# Patient Record
Sex: Female | Born: 1970 | Race: White | Hispanic: No | Marital: Single | Smoking: Current every day smoker
Health system: Southern US, Community
[De-identification: ages and names within clinical notes are randomized; demographics above are authoritative.]

## PROBLEM LIST (undated history)

## (undated) DIAGNOSIS — D34 Benign neoplasm of thyroid gland: Secondary | ICD-10-CM

## (undated) DIAGNOSIS — G4700 Insomnia, unspecified: Secondary | ICD-10-CM

## (undated) DIAGNOSIS — R51 Headache: Secondary | ICD-10-CM

## (undated) DIAGNOSIS — Z9889 Other specified postprocedural states: Secondary | ICD-10-CM

## (undated) DIAGNOSIS — D369 Benign neoplasm, unspecified site: Secondary | ICD-10-CM

## (undated) HISTORY — DX: Headache: R51

## (undated) HISTORY — DX: Other specified postprocedural states: Z98.890

## (undated) HISTORY — DX: Benign neoplasm, unspecified site: D36.9

## (undated) HISTORY — DX: Insomnia, unspecified: G47.00

## (undated) HISTORY — DX: Benign neoplasm of thyroid gland: D34

---

## 1998-02-16 ENCOUNTER — Other Ambulatory Visit: Admission: RE | Admit: 1998-02-16 | Discharge: 1998-02-16 | Payer: Self-pay | Admitting: Obstetrics and Gynecology

## 1999-07-13 ENCOUNTER — Emergency Department (HOSPITAL_COMMUNITY): Admission: EM | Admit: 1999-07-13 | Discharge: 1999-07-13 | Payer: Self-pay | Admitting: Internal Medicine

## 2001-09-05 ENCOUNTER — Encounter: Payer: Self-pay | Admitting: Emergency Medicine

## 2001-09-05 ENCOUNTER — Emergency Department (HOSPITAL_COMMUNITY): Admission: EM | Admit: 2001-09-05 | Discharge: 2001-09-05 | Payer: Self-pay | Admitting: Emergency Medicine

## 2002-08-16 ENCOUNTER — Emergency Department (HOSPITAL_COMMUNITY): Admission: EM | Admit: 2002-08-16 | Discharge: 2002-08-16 | Payer: Self-pay | Admitting: Emergency Medicine

## 2002-08-16 ENCOUNTER — Other Ambulatory Visit (HOSPITAL_COMMUNITY): Admission: RE | Admit: 2002-08-16 | Discharge: 2002-09-09 | Payer: Self-pay | Admitting: Psychiatry

## 2002-11-18 ENCOUNTER — Inpatient Hospital Stay (HOSPITAL_COMMUNITY): Admission: EM | Admit: 2002-11-18 | Discharge: 2002-11-21 | Payer: Self-pay | Admitting: Psychiatry

## 2003-04-11 ENCOUNTER — Other Ambulatory Visit: Admission: RE | Admit: 2003-04-11 | Discharge: 2003-04-11 | Payer: Self-pay | Admitting: Obstetrics and Gynecology

## 2003-10-12 ENCOUNTER — Inpatient Hospital Stay (HOSPITAL_COMMUNITY): Admission: RE | Admit: 2003-10-12 | Discharge: 2003-10-15 | Payer: Self-pay | Admitting: Obstetrics and Gynecology

## 2003-12-06 ENCOUNTER — Other Ambulatory Visit: Admission: RE | Admit: 2003-12-06 | Discharge: 2003-12-06 | Payer: Self-pay | Admitting: Obstetrics and Gynecology

## 2004-09-30 DIAGNOSIS — D369 Benign neoplasm, unspecified site: Secondary | ICD-10-CM

## 2004-09-30 DIAGNOSIS — D34 Benign neoplasm of thyroid gland: Secondary | ICD-10-CM

## 2004-09-30 HISTORY — DX: Benign neoplasm, unspecified site: D36.9

## 2004-09-30 HISTORY — DX: Benign neoplasm of thyroid gland: D34

## 2004-10-25 ENCOUNTER — Ambulatory Visit (HOSPITAL_COMMUNITY): Admission: RE | Admit: 2004-10-25 | Discharge: 2004-10-25 | Payer: Self-pay | Admitting: Internal Medicine

## 2008-10-26 ENCOUNTER — Emergency Department (HOSPITAL_COMMUNITY): Admission: EM | Admit: 2008-10-26 | Discharge: 2008-10-26 | Payer: Self-pay | Admitting: Emergency Medicine

## 2008-10-30 ENCOUNTER — Emergency Department (HOSPITAL_COMMUNITY): Admission: EM | Admit: 2008-10-30 | Discharge: 2008-10-30 | Payer: Self-pay | Admitting: Emergency Medicine

## 2008-11-16 ENCOUNTER — Encounter: Admission: RE | Admit: 2008-11-16 | Discharge: 2008-11-16 | Payer: Self-pay | Admitting: Orthopaedic Surgery

## 2010-09-30 HISTORY — PX: MASTECTOMY: SHX3

## 2010-10-21 ENCOUNTER — Encounter: Payer: Self-pay | Admitting: Endocrinology

## 2011-02-15 NOTE — Discharge Summary (Signed)
NAMESATSUKI, ZILLMER                        ACCOUNT NO.:  [id number]   MEDICAL RECORD NO.:  [id number]                   PATIENT TYPE:  INP   LOCATION:  9133                                 FACILITY:  [facility]   PHYSICIAN:  Maxie Better, M.D.            DATE OF BIRTH:  09-Nov-1970   DATE OF ADMISSION:  10/12/2003  DATE OF DISCHARGE:  10/15/2003                                 DISCHARGE SUMMARY   ADMISSION DIAGNOSIS:  Term gestation, previous difficult delivery.   DISCHARGE DIAGNOSES:  1. Term gestation, delivered.  2. Status post primary cesarean section.  3. Previous difficult delivery.  4. Polyhydramnios.  5. Postoperative anemia.   PROCEDURES:  Primary cesarean section, Kerr hysterotomy.   HISTORY OF PRESENT ILLNESS:  A 40 year old gravida 3 para 1-0-1-1 female at  term admitted for a primary cesarean section secondary to previous difficult  delivery.  The patient had an 8-pound 4-ounce baby via previous attempt at  vacuum delivery and a mid forceps application.  Please see the dictated H&P  for the specific details.  The patient had an uncomplicated prenatal course.   HOSPITAL COURSE:  The patient was admitted to [facility].  She was  taken to the operating room where she underwent a primary cesarean section  with resultant delivery of an 8 pound live female infant with cord around  the neck x1, Apgars of 9 and 9.  Copious amount of clear amniotic fluid was  noted.  The vertex was floating at the time of delivery.  Normal tubes and  ovaries were noted bilaterally.  The patient had an uncomplicated  postoperative course.  Her CBC on postoperative day #1 showed a hemoglobin  9.7; hematocrit 27.5; white count of 10.4; platelet count of 166,000.  By  postoperative day #3 the patient who had remained afebrile showed no  evidence of infection.  She was tolerating a regular diet, was deemed well  to be discharged home.   DISPOSITION:  Home.   CONDITION:  Stable.   DISCHARGE MEDICATIONS:  1. Tylox one to two tablets q.3-4h. p.r.n. pain #18.  2. Slow FE one p.o. b.i.d.  3. Prenatal vitamins one p.o. daily.   FOLLOW-UP:  Follow-up appointment at [facility] in 4 weeks.   DISCHARGE INSTRUCTIONS:  1. Call for temperature greater than or equal to 100.4.  2. Nothing per vagina for 4-6 weeks.  3. No heavy lifting or driving for 2 weeks.  4. Call if severe abdominal pain, nausea or vomiting, increased incisional     pain, redness and drainage from the incisional site, soaking a regular     pad every hour or more frequently.                                               Maxie Better, M.D.  [place]/MEDQ  D:  11/18/2003  T:  11/19/2003  Job:  [id number]

## 2011-02-15 NOTE — Op Note (Signed)
NAME:  [name], [name]                        ACCOUNT NO.:  [id number]   MEDICAL RECORD NO.:  [id number]                   PATIENT TYPE:  INP   LOCATION:  9198                                 [facility]ILITY:  [facility]   PHYSICIAN:  Maxie Better, M.D.            DATE OF BIRTH:  1971-03-25   DATE OF PROCEDURE:  10/12/2003  DATE OF DISCHARGE:                                 OPERATIVE REPORT   PREOPERATIVE DIAGNOSES:  1. Previous difficult delivery.  2. Term gestation.   PROCEDURE:  Primary cesarean section, Kerr hysterotomy.   POSTOPERATIVE DIAGNOSES:  1. Previous difficult delivery.  2. Term gestation.  3. Polyhydramnios.   ANESTHESIA:  Spinal.   SURGEON:  Maxie Better, M.D.   ASSISTANT:  Genia Del, M.D.   INDICATIONS:  This is a 40 year old gravida 2, para 1, female at term, with  a previous difficult delivery of an 8 pound 3 ounce baby, who now presents  for primary cesarean section.  Her prenatal course has been complicated by  chest pain, for which she underwent extensive cardiology workup with a final  diagnosis of gastroesophageal reflux disease.  Risks and benefits of the  procedure have been explained to the patient, consent was signed.  The  patient was transferred to the operating room.   PROCEDURE:  Under adequate spinal anesthesia, the patient was placed in a  supine position with a left lateral tilt.  She was thoroughly prepped and  draped in the usual fashion.  An indwelling Foley catheter was sterilely  placed.  Marcaine 0.25% 6 mL was injected along the planned Pfannenstiel  skin incision.  The Pfannenstiel skin incision was then made, carried down  to the rectus fascia.  The rectus fascia was incised in the midline,  extended bilaterally.  The rectus fascia was then bluntly and sharply  dissected off the rectus muscle in superior and inferior fashion.  The  rectus muscle was split in the midline, the parietal peritoneum entered  bluntly.  The  vesicouterine peritoneum was then developed.  The bladder was  bluntly dissected off the low uterine segment and displaced inferiorly using  a bladder retractor.  On entering the abdominal cavity and inspection of the  uterus, vertex was noted to be floating.  A curvilinear low transverse  incision was then made and extended bilaterally using bandage scissors.  Artificial rupture of membranes was then performed.  A copious amount of  clear amniotic fluid was noted.  Initial attempt at delivery of the vertex  was unsuccessful due to the mobility of the vertex presentation; therefore,  a vacuum was applied with subsequent delivery of a live female from the  right occiput transverse position.  A cord was around the neck loose, was  easily reduced.  The baby was bulb-suctioned on the abdomen, the cord was  clamped and cut, and the baby was transferred to the awaiting pediatricians,  who assigned Apgars of 9 and 9  at one and five minutes.  The placenta was  spontaneous intact.  The uterine cavity was cleaned of debris.  The uterine  incision showed no extension.  The incision was closed in two layers, a  first layer of 0 Monocryl running locked stitch, second layer was imbricated  using 0 Monocryl suture.  Good hemostasis was then noted.  The paracolic  gutters were cleaned of debris.  Normal tubes and ovaries were noted  bilaterally.  The vesicouterine peritoneum and the parietal peritoneum were  not closed.  The rectus fascia was inspected, no bleeders noted.  The rectus  fascia was closed with 0 Vicryl x2.  The skin was approximated using Ethicon  staples after the subcutaneous area was irrigated and suctioned.  The  specimen was placenta, not sent to pathology.  Estimated blood loss was 600  mL.  Intraoperative fluid was 2 L crystalloid.  Urine output was 200 mL  clear yellow urine.  Sponge and instrument count x2 was correct.  Complication was none.  The patient tolerated the procedure well,  was  transferred to the recovery room in stable condition.  The weight of the  baby was 8 pounds.                                               Maxie Better, M.D.    [facility]/MEDQ  D:  10/12/2003  T:  10/12/2003  Job:  [id number]

## 2011-02-15 NOTE — Discharge Summary (Signed)
[name], [name]                          ACCOUNT NO.:  [id number]   MEDICAL RECORD NO.:  [id number]                   PATIENT TYPE:  IPS   LOCATION:  0300                                 [facility]   PHYSICIAN:  Hipolito Bayley, M.D.               DATE OF BIRTH:  1970-10-06   DATE OF ADMISSION:  11/18/2002  DATE OF DISCHARGE:  11/21/2002                                 DISCHARGE SUMMARY   PATIENT IDENTIFICATION:  The patient is a 40 year old white married female  admitted on involuntary papers.  The patient was admitted because of abuse  of cocaine and marijuana.  She reported poor sleep, irritability, increased  depression, hopelessness and helplessness, as well as feeling powerless over  her addiction.  Prior to current deterioration, she completed chemical  dependency outpatient clinic and stayed sober for over 90 days.   PHYSICAL EXAMINATION:  Initial physical examination was normal.   INITIAL DIAGNOSTIC IMPRESSION:   AXIS I:  1. Major depressive disorder, recurrent.  2. Cocaine abuse, rule out dependence.  3. Cannabis abuse in remission.   AXIS II:  No diagnosis.   AXIS III:  Sinusitis secondary to chemical irritation.   AXIS IV:  Diagnosis deferred.   AXIS V:  Global assessment of functioning 36 upon admission, 74 maximum for  the past year.   HOSPITAL COURSE:  After admission to the ward, the patient was placed on  special observation.  She was started on Symmetrel p.r.n. cravings.  Jeanice Lim, M.D., who saw the patient on February 20, started her on  Wellbutrin XL 150 mg daily, which the patient tolerated well.  On February  22, the patient showed superficially brighter affect, showing some more  insight into her problems.  The patient wanted to go to [facility] for  long-term rehabilitation.  Family meeting with the patient's husband took  place on February 22.  Both the patient and her husband felt that she was  ready for discharge and  decision was made to discharge the patient to  [facility] with admission date scheduled on February 23.  After  discharge from [facility], the patient will follow Intensive Outpatient  Program in [facility].   Medical problems during this brief hospital stay: The patient did not show  any major medical problems.  Review of blood work showed normal CBC with  differential, normal chemistry 17, normal thyroid panel.  Urine drug screen  was positive for cocaine metabolites and benzodiazepines.  Urinalysis was  normal.   Vital signs were stable throughout the hospitalization.   DISCHARGE DIAGNOSES:   AXIS I:  1. Major depressive disorder, recurrent, moderate.  2. Cocaine and cannabis abuse.   AXIS II:  No diagnosis.   AXIS III:  Sinusitis.   AXIS IV:  Moderate stressors, family circumstances, and substance abuse.   AXIS V:  Global assessment of functioning upon admission was 36,  upon  discharge 60, maximum for the past year 30.   DISCHARGE MEDICATIONS:  1. Symmetrel 100 mg to take up to twice a day as needed for cravings.  2. Wellbutrin XL 150 mg daily.  3. Trazodone 50 mg one half or one as needed for insomnia.   DISCHARGE INSTRUCTIONS:  The patient should call or come to the emergency  department if recurrence of symptoms or side effects from medications.  She  was supposed to start Intensive Outpatient Program on November 22, 2002, and  also will have an interview at [facility] for long-term treatment.  The  patient understood instructions and in good condition was discharged home in  the care of her husband.                                               Hipolito Bayley, M.D.    JS/MEDQ  D:  12/26/2002  T:  12/27/2002  Job:  [id number]

## 2011-02-15 NOTE — H&P (Signed)
NAME:  [name], [name]                        ACCOUNT NO.:  [id number]   MEDICAL RECORD NO.:  [id number]                   PATIENT TYPE:  INP   LOCATION:  NA                                   [facility]   PHYSICIAN:  Maxie Better, M.D.            DATE OF BIRTH:  [birth year]   DATE OF ADMISSION:  10/12/2003  DATE OF DISCHARGE:                                HISTORY & PHYSICAL   CHIEF COMPLAINT:  Primary cesarean section secondary to previous difficult  delivery.   HISTORY OF PRESENT ILLNESS:  This is a 40 year old gravida 3, para 1-0-1-1,  married white female, last menstrual period unsure, Jan 29, 2003, [facility]  November 05, 2003, however that was changed to October 18, 2003, on June 23, 2003, at which time the patient by ultrasound was 23.3 weeks.  She is  currently [redacted] weeks pregnant and being admitted for a primary cesarean  section secondary to previous difficult delivery.  The patient's history is  notable for previous attempted vacuum delivery with a midforceps application  secondary to fetal bradycardia with resultant delivery of an 8 pound 4 ounce  baby at 41-4/7 weeks, with a McRoberts done at the time but not for shoulder  dystocia per the records.  The patient has consistently measured size  greater than dates since about 30 weeks' gestation.  She had an elevated one-  hour glucose challenge test and a normal three-hour glucose tolerance test.  Ultrasound on August 15, 2003, for size greater than dates at that time  showed the breech presentation, amniotic fluid index of 85th percentile, and  a 3 pound 10 ounce baby, which was at the 37th percentile.  The patient has  had good fetal movement, no contractions.  Her prenatal course has been  complicated by chest pains, for which she underwent extensive evaluation by  the cardiologist and was diagnosed with reflux disease and placed on Nexium.  The patient is a recovering addict who has done well.  Prenatal care is  at  [facility].  Primary obstetrician Maxie Better, M.D.   PRENATAL LABORATORY DATA:  Blood type is A positive, antibody screen is  negative.  Toxoplasmosis is negative.  RPR is nonreactive. Rubella is  immune.  Hepatitis B surface antigen is negative.  HIV test was negative.  GC and Chlamydia cultures were negative.  Pap was normal. AFP-3 test was  normal.  Three-hour GTT was normal, abnormal one-hour glucose challenge  test.  Normal anatomic fetal survey on June 23, 2003, at which time the  patient was 23.3 weeks.  Group B strep culture was negative.   PAST MEDICAL HISTORY:  No known drug allergies.  Medicines are prenatal  vitamins and Nexium.   Medical history:  Recovering cocaine addict since November 2003.  Gastroesophageal reflux disease diagnosed during the pregnancy.  Surgical  history:  D&E June 1989.   PAST OBSTETRICAL HISTORY:  June 1989, elective first  trimester termination.  October 1994, 8 pound 4 ounce baby vaginally after vacuum and forceps, mid-  forceps, 8 pound 4 ounce baby at 41+ weeks.   FAMILY HISTORY:  Noncontributory.   SOCIAL HISTORY:  Married, R.N., nonsmoker.  One child.   REVIEW OF SYSTEMS:  Positive for increasing heartburn despite the Nexium.  Otherwise negative.   PHYSICAL EXAMINATION:  GENERAL:  Well-developed, well-nourished, gravid  white female in no acute distress.  VITAL SIGNS:  Blood pressure 120/82, weight 168 pounds.  Fetal heart rate is  148.  SKIN:  No lesions.  HEENT:  Anicteric sclerae, pink conjunctivae.  Oropharynx negative.  CARDIAC:  Regular rate and rhythm without murmur.  CHEST:  Lungs were clear to auscultation.  BREASTS:  Soft, nontender, no palpable mass.  ABDOMEN:  Gravid, fundal height of 41 cm, estimated fetal weight 8+ pounds.  PELVIC:  1 cm, 50%, vertex at -3.  EXTREMITIES:  No edema.   IMPRESSION:  1. Previous difficult delivery.  2. Term gestation.   PLAN:  Admission, routine admission labs,  primary cesarean section.  Risks  of the cesarean section were reviewed with the patient, including but not  limited to infection, bleeding, risk of blood transfusion, injury to  surrounding organ structures such as the bladder, bowel, or ureter, possible  cesarean section in the future, internal scar tissue, postop care, and  criteria for discharge were reviewed, all questions answered.                                               Maxie Better, M.D.    [place]/MEDQ  D:  10/04/2003  T:  10/04/2003  Job:  [id number]

## 2011-02-15 NOTE — H&P (Signed)
NAMECHRISTEL, [name]                          ACCOUNT NO.:  [id number]   MEDICAL RECORD NO.:  [id number]                   PATIENT TYPE:  IPS   LOCATION:  0300                                 [facility]   PHYSICIAN:  Geoffery Lyons, M.D.                   DATE OF BIRTH:  03/24/1971   DATE OF ADMISSION:  11/18/2002  DATE OF DISCHARGE:                         PSYCHIATRIC ADMISSION ASSESSMENT   DATE OF ASSESSMENT:  November 19, 2002   PATIENT IDENTIFICATION:  This is a 40 year old white female who is married,  voluntary admission.   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  She relapsed on cocaine the night prior to admission  and felt hopeless and ashamed of herself.  She felt she no longer wanted to  live because she felt unable to control her cocaine cravings and her mood  irritability, which had increased over the last two to three weeks.  The  patient reports that this relapse was preceded by about two to four weeks of  some increased irritability, poor sleep, and poor concentration.  She has  felt that she did not want to get out of bed and do anything but has been  forcing herself to get up and do household activities.  She reports stopping  Paxil approximately two to three months ago under the supervision of her  physician.  She had felt that it had decreased her appetite and caused her  some sexual side effects and she wanted to try going without it while she  was clean and sober from the cocaine.  The patient has felt increase in  cocaine cravings that accompanied this irritability over the past week or  two.  She endorses some hopelessness and suicidal ideation today but is able  to promise safety on the unit.  She denies any homicidal ideation or any  auditory and visual hallucinations.   PAST PSYCHIATRIC HISTORY:  The patient recently completed  [facility]  approximately 90 days ago in our outpatient clinic.  That was her first and  only outpatient treatment.  This is her first inpatient treatment.  She had  taken some Prozac in the distant past for depression and after that, took  Paxil for several years.  She had started having some panic attacks as a  teenager.  She has a history of cocaine abuse, having been clean  approximately 90 days and has a distant history of regular use or marijuana  but has also stopped using that in the past 90 days.   SUBSTANCE ABUSE HISTORY:  Alcohol and drug history is as noted above.   PAST MEDICAL HISTORY:  The patient has no regular primary care [name].  Medical problems: She denies any current medical problems.  She denies any  risk factors for STD.  REVIEW OF SYSTEMS:  Review of systems is remarkable for some mild sinusitis  and runny nose that she attributes to cocaine snorting last night.   MEDICATIONS:  None.   DRUG ALLERGIES:  None.   PHYSICAL EXAMINATION:  GENERAL:  This is a slim built female who is in no  acute distress.  VITAL SIGNS:  Temperature 97.6, pulse 81, respirations 16, blood pressure  113/74.  She is 5 feet 6 inches tall and weighs 114 pounds.  SKIN: Skin is clear, no rashes, no unusual markings, nonicteric.  HEENT:  Head is normocephalic and atraumatic.  Hygiene is good.  EENT:  PERRLA.  Ocular tracking is normal.  Sclerae are nonicteric.  Hearing intact  to normal voice.  Oropharynx is noninjected.  She is having some mild clear  rhinorrhea that is very slight at this point.  Her nasal septum is intact.  NECK:  Supple without thyromegaly or lymphadenopathy, full range of motion.  CARDIOVASCULAR:  S1 and S2, no clicks, murmurs, or gallops, regular rate and  rhythm.  LUNGS:  Clear to auscultation.  ABDOMEN:  Flat, soft, and nontender; no masses appreciated.  GENITALIA:  Deferred.  MUSCULOSKELETAL:  Strength is 5/5, posture upright, gait grossly normal, no  erythema  or swelling of any joints.  NEUROLOGIC:  Cranial nerves II-XII are intact.  Grip strength: Equal  bilaterally.  Facial symmetry is present.  Smooth motor.  Sensory: Grossly  intact.  No focal findings.   LABORATORY DATA:  Laboratory findings reveal the patient's CBC is normal;  hemoglobin 13.1, hematocrit 38, MCV 88.2, platelets 219,000.  Metabolic  panel is normal with normal electrolytes.  BUN 16, creatinine 0.9.  TSH is  0.649.  Urine pregnancy test is pending along with a UA and a drug screen.   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  She  is now taking steps to get her license back and looks forward to going back  to work and pursuing a Public librarian.  She is married with a  supportive husband who is a nonuser and she has a 49-year-old son.  She  reports generally good relationships and no legal problems.   FAMILY HISTORY:  Family history is remarkable for a brother who abused  marijuana and grandparents who both abused alcohol.   MENTAL STATUS EXAM:  This is a petite built female who is fully alert with a  tearful affect.  She feels quite ashamed and guilty about her relapse.  She  well focused, calm, become immediately tearful discussing her relapse and a  sense of hopelessness that she has about her ability to control the cocaine  cravings.  Her speech is generally clear and nonpressured.  She is quite  articulate.  Mood is depressed.  Thought process is logical and coherent; no  evidence of paranoia.  No auditory and visual hallucinations are noted.  She  is positive for suicidal ideation without any clear plan; no homicidal  ideation.  Cognitive: Intact and oriented x 3.  Intelligence is above  average.  Impulse control: Poor.  Judgment: Fair to poor.  Insight: Satisfactory.  Her recall is intact, both recent and remote.   ADMISSION DIAGNOSES:   AXIS I:  1. Major  depressive disorder, recurrent, severe.  2. Cocaine abuse, rule out dependence.  3. 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   AXIS II:  No diagnosis.   AXIS III:  Sinusitis secondary to chemical irritation.   AXIS IV:  Deferred.   AXIS V:  Current 36, past year 53.   INITIAL PLAN OF CARE:  Plan is to voluntarily admit the patient to treat her  depression and to alleviate her suicidal ideation and to address her cocaine  cravings.  We have elected to start her on Symmetrel 100 mg p.o. b.i.d. for  the cravings.  We will also start her on Wellbutrin XL 150 mg p.o. q.a.m.  and we have confirmed that she has no history of seizure disorder,  blackouts, or loss of consciousness.  Meanwhile, we have discussed the  possibility of going back to chemical dependency outpatient clinic for  followup and for the additional support that it will give her and she is  agreeable to that, feels that this will be helpful to her.  Meanwhile, her  urine drug screen is pending and we have given her some saline nasal spray  for comfort.  We have discussed the risks and benefits of this plan  including these medications and any possible side effects and we are going  to give her some written information.  She is agreeable to this plan of care  and has asked some pertinent questions.   ESTIMATED LENGTH OF STAY:  Three to four days.     Margaret A. Scott, N.P.                   Geoffery Lyons, M.D.    MAS/MEDQ  D:  11/19/2002  T:  11/19/2002  Job:  [id number]

## 2011-10-01 HISTORY — PX: TUBAL LIGATION: SHX77

## 2012-04-30 ENCOUNTER — Ambulatory Visit: Payer: Self-pay | Admitting: Physical Therapy

## 2012-09-05 ENCOUNTER — Ambulatory Visit (INDEPENDENT_AMBULATORY_CARE_PROVIDER_SITE_OTHER): Payer: Commercial Managed Care - PPO | Admitting: Family Medicine

## 2012-09-05 ENCOUNTER — Ambulatory Visit: Payer: Commercial Managed Care - PPO

## 2012-09-05 VITALS — BP 130/82 | HR 83 | Temp 98.1°F | Resp 16 | Ht 67.0 in | Wt 143.6 lb

## 2012-09-05 DIAGNOSIS — M542 Cervicalgia: Secondary | ICD-10-CM

## 2012-09-05 MED ORDER — METHYLPREDNISOLONE 4 MG PO TABS: ORAL_TABLET | ORAL | Status: DC
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
## 2012-09-06 ENCOUNTER — Telehealth: Payer: Self-pay

## 2012-09-06 NOTE — Telephone Encounter (Signed)
LMOM to CB. 

## 2012-09-06 NOTE — Telephone Encounter (Signed)
Spoke with pt advised message from Dr Milus Glazier. Pt understood

## 2012-09-06 NOTE — Telephone Encounter (Addendum)
PATIENT STATES SHE SAW DR. Milus Glazier SAT. FOR NECK PAIN. HE TOLD HER TO CALL HIM BACK TODAY TO LET HIM KNOW HOW SHE WAS DOING. SHE SAID SHE CAN MOVE HER NECK A LITTLE BETTER, HOWEVER, IT STILL HURTS. SHE HAS TAKEN 3 DOSES OF THE PREDNISONE SO FAR. BEST PHONE [phone] (CELL)   [facility] [address]   MBC  Patient needs to give the medicine a little more time to work. She can add aleve or ibuprofen in the meantime

## 2012-09-08 ENCOUNTER — Other Ambulatory Visit: Payer: Self-pay | Admitting: Family Medicine

## 2012-09-08 ENCOUNTER — Telehealth: Payer: Self-pay

## 2012-09-08 DIAGNOSIS — M542 Cervicalgia: Secondary | ICD-10-CM

## 2012-09-08 NOTE — Telephone Encounter (Signed)
I have left message to advise, if she has questions, she is to call me back.

## 2012-09-08 NOTE — Telephone Encounter (Signed)
I will refer to orthopedics.

## 2012-09-08 NOTE — Telephone Encounter (Signed)
Dr Milus Glazier;  Patient stated that you told here to call back in she is was not doing better.  Patient states that she is still in pain at night and in the morning.  Also patient wanted to let you know that the medicine your prescribed her are not working.   Best#: [phone]   [facility] [address]

## 2012-12-08 ENCOUNTER — Encounter: Payer: Self-pay | Admitting: Internal Medicine

## 2012-12-08 ENCOUNTER — Ambulatory Visit (INDEPENDENT_AMBULATORY_CARE_PROVIDER_SITE_OTHER): Payer: Commercial Managed Care - PPO | Admitting: Internal Medicine

## 2012-12-08 VITALS — BP 120/80 | HR 107 | Temp 98.5°F | Ht 66.5 in | Wt 140.0 lb

## 2012-12-08 DIAGNOSIS — Z862 Personal history of diseases of the blood and blood-forming organs and certain disorders involving the immune mechanism: Secondary | ICD-10-CM

## 2012-12-08 DIAGNOSIS — F17210 Nicotine dependence, cigarettes, uncomplicated: Secondary | ICD-10-CM

## 2012-12-08 DIAGNOSIS — R51 Headache: Secondary | ICD-10-CM

## 2012-12-08 DIAGNOSIS — M542 Cervicalgia: Secondary | ICD-10-CM

## 2012-12-08 DIAGNOSIS — Z8639 Personal history of other endocrine, nutritional and metabolic disease: Secondary | ICD-10-CM

## 2012-12-08 DIAGNOSIS — F17200 Nicotine dependence, unspecified, uncomplicated: Secondary | ICD-10-CM

## 2012-12-08 DIAGNOSIS — R519 Headache, unspecified: Secondary | ICD-10-CM | POA: Insufficient documentation

## 2012-12-08 DIAGNOSIS — F430 Acute stress reaction: Secondary | ICD-10-CM

## 2012-12-08 MED ORDER — LORAZEPAM 1 MG PO TABS: ORAL_TABLET | ORAL | Status: DC

## 2012-12-08 NOTE — Patient Instructions (Signed)
Will arrange consult referal to neuro Headache specialist about the neck and head pain  As we discussed  . You may benefit from    Limiting  The  Daily OTC pain meds  .       Calendar ha diary.     Limit  caffiene if possible.   Can try   Short term  Ativan at night if needed for   Stress and sleep .     Can be habit forming in the long term but may help you at present.  Counseling for the PTSD type sx may help get through this quicker  .  Your reaction is not abnormal to what has happened.   Check into hospital medical support programs  Or  [name]   As discussed.
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
## 2012-12-09 ENCOUNTER — Telehealth: Payer: Self-pay | Admitting: Family Medicine

## 2012-12-09 NOTE — Telephone Encounter (Signed)
Left message on cell for the pt to return my call. 

## 2012-12-09 NOTE — Telephone Encounter (Signed)
Is she taking tramadol   dont remember what she said .  If tramadol not working we could use  Short term  Limited .   Hydrocodone   ( vicodin)Disp 30    No refills 5/ 325  vicodin  Take 1/2 to 1 po bid if needed for severe pain.( please  Make sure she records when she takes this in her head ache calendar  That she will take to the neurologist)

## 2012-12-09 NOTE — Telephone Encounter (Signed)
Patient returned by call.  I called her back and left a message on her cell.

## 2012-12-09 NOTE — Telephone Encounter (Signed)
The patient called and said she is having very bad neck pain.  You seen her yesterday for this problem.  She would like a medication called to the pharmacy for the pt.  Please advise.  Thanks!!

## 2012-12-10 ENCOUNTER — Other Ambulatory Visit: Payer: Self-pay | Admitting: Family Medicine

## 2012-12-10 MED ORDER — HYDROCODONE-ACETAMINOPHEN 5-325 MG PO TABS: 1.0000 | ORAL_TABLET | Freq: Two times a day (BID) | ORAL | Status: DC

## 2012-12-10 NOTE — Telephone Encounter (Signed)
Left a detailed message on the pt's cell (per the pt) informing her that I was calling in hydrocodone-acetaminophen to the pharmacy.  Left instructions for her NOT to take the tramadol and hydrocodone together.  If she had any further questions than she should call the office.  Called the prescription to the pharmacy and left on voicemail.  Also left instructions for the pharmacist to counsel the patient so she does NOT mix the tramadol and hydrocodone together.  Instructed the pharmacist to call back if any questions.

## 2012-12-17 ENCOUNTER — Other Ambulatory Visit: Payer: Self-pay | Admitting: Neurology

## 2012-12-17 DIAGNOSIS — D352 Benign neoplasm of pituitary gland: Secondary | ICD-10-CM

## 2012-12-25 ENCOUNTER — Telehealth: Payer: Self-pay | Admitting: Internal Medicine

## 2012-12-25 ENCOUNTER — Encounter: Payer: Self-pay | Admitting: Internal Medicine

## 2012-12-25 ENCOUNTER — Ambulatory Visit (INDEPENDENT_AMBULATORY_CARE_PROVIDER_SITE_OTHER): Payer: Commercial Managed Care - PPO | Admitting: Internal Medicine

## 2012-12-25 VITALS — BP 110/70 | HR 75 | Temp 98.5°F | Wt 142.0 lb

## 2012-12-25 DIAGNOSIS — R51 Headache: Secondary | ICD-10-CM

## 2012-12-25 DIAGNOSIS — M542 Cervicalgia: Secondary | ICD-10-CM

## 2012-12-25 MED ORDER — HYDROCODONE-ACETAMINOPHEN 5-325 MG PO TABS: 1.0000 | ORAL_TABLET | Freq: Two times a day (BID) | ORAL | Status: DC

## 2012-12-25 NOTE — Patient Instructions (Addendum)
F/u Dr Vela Prose  Cervical Sprain A cervical sprain is an injury in the neck in which the ligaments are stretched or torn. The ligaments are the tissues that hold the bones of the neck (vertebrae) in place.Cervical sprains can range from very mild to very severe. Most cervical sprains get better in 1 to 3 weeks, but it depends on the cause and extent of the injury. Severe cervical sprains can cause the neck vertebrae to be unstable. This can lead to damage of the spinal cord and can result in serious nervous system problems. Your caregiver will determine whether your cervical sprain is mild or severe. CAUSES  Severe cervical sprains may be caused by:  Contact sport injuries (football, rugby, wrestling, hockey, auto racing, gymnastics, diving, martial arts, boxing).  Motor vehicle collisions.  Whiplash injuries. This means the neck is forcefully whipped backward and forward.  Falls. Mild cervical sprains may be caused by:   Awkward positions, such as cradling a telephone between your ear and shoulder.  Sitting in a chair that does not offer proper support.  Working at a poorly Marketing executive station.  Activities that require looking up or down for long periods of time. SYMPTOMS   Pain, soreness, stiffness, or a burning sensation in the front, back, or sides of the neck. This discomfort may develop immediately after injury or it may develop slowly and not begin for 24 hours or more after an injury.  Pain or tenderness directly in the middle of the back of the neck.  Shoulder or upper back pain.  Limited ability to move the neck.  Headache.  Dizziness.  Weakness, numbness, or tingling in the hands or arms.  Muscle spasms.  Difficulty swallowing or chewing.  Tenderness and swelling of the neck. DIAGNOSIS  Most of the time, your caregiver can diagnose this problem by taking your history and doing a physical exam. Your caregiver will ask about any known problems, such as  arthritis in the neck or a previous neck injury. X-rays may be taken to find out if there are any other problems, such as problems with the bones of the neck. However, an X-ray often does not reveal the full extent of a cervical sprain. Other tests such as a computed tomography (CT) scan or magnetic resonance imaging (MRI) may be needed. TREATMENT  Treatment depends on the severity of the cervical sprain. Mild sprains can be treated with rest, keeping the neck in place (immobilization), and pain medicines. Severe cervical sprains need immediate immobilization and an appointment with an orthopedist or neurosurgeon. Several treatment options are available to help with pain, muscle spasms, and other symptoms. Your caregiver may prescribe:  Medicines, such as pain relievers, numbing medicines, or muscle relaxants.  Physical therapy. This can include stretching exercises, strengthening exercises, and posture training. Exercises and improved posture can help stabilize the neck, strengthen muscles, and help stop symptoms from returning.  A neck collar to be worn for short periods of time. Often, these collars are worn for comfort. However, certain collars may be worn to protect the neck and prevent further worsening of a serious cervical sprain. HOME CARE INSTRUCTIONS   Put ice on the injured area.  Put ice in a plastic bag.  Place a towel between your skin and the bag.  Leave the ice on for 15 to 20 minutes, 3 to 4 times a day.  Only take over-the-counter or prescription medicines for pain, discomfort, or fever as directed by your caregiver.  Keep all follow-up appointments  as directed by your caregiver.  Keep all physical therapy appointments as directed by your caregiver.  If a neck collar is prescribed, wear it as directed by your caregiver.  Do not drive while wearing a neck collar.  Make any needed adjustments to your work station to promote good posture.  Avoid positions and activities  that make your symptoms worse.  Warm up and stretch before being active to help prevent problems. SEEK MEDICAL CARE IF:   Your pain is not controlled with medicine.  You are unable to decrease your pain medicine over time as planned.  Your activity level is not improving as expected. SEEK IMMEDIATE MEDICAL CARE IF:   You develop any bleeding, stomach upset, or signs of an allergic reaction to your medicine.  Your symptoms get worse.  You develop new, unexplained symptoms.  You have numbness, tingling, weakness, or paralysis in any part of your body. MAKE SURE YOU:   Understand these instructions.  Will watch your condition.  Will get help right away if you are not doing well or get worse. Document Released: 07/14/2007 Document Revised: 12/09/2011 Document Reviewed: 06/19/2011 [facility] Patient Information 2013 [place], [place].

## 2012-12-25 NOTE — Telephone Encounter (Signed)
Patient Information:  Caller Name: Chyann  Phone: [phone]  Patient: [name], [name]  Gender: Female  DOB: March 03, 1971  Age: 42 Years  PCP: Berniece Andreas [facility])  Pregnant: No  Office Follow Up:  Does the office need to follow up with this patient?: No  Instructions For The Office: N/A   Symptoms  Reason For Call & Symptoms: Patient states she saw Dr. Fabian Sharp several weeks ago for neck pain. She was referred to "Headache and Neck pain clinic - Dr. Clarisse Gouge".  Seen on 11/11/12.  She was prescribed Midrol  and etordolac. ? Arthritis in her Neck.  She will see Dr. Clarisse Gouge on 01/20/13. She reports they  medication is causing naussea and diarrhea and headaches and decreased appetite.  She tried to call the office today but they closed at 2:00pm.  She is complaining of increase pain in neck (at base of her head) and down her back . She stopped the medication 3 days ago because of the side effects. Increasing pain today.  Reviewed Health History In EMR: Yes  Reviewed Medications In EMR: Yes  Reviewed Allergies In EMR: Yes  Reviewed Surgeries / Procedures: Yes  Date of Onset of Symptoms: 11/11/2012  Treatments Tried: Yoga, Ibuprofen , Lidoderm patches at night  Treatments Tried Worked: Yes OB / GYN:  LMP: 12/20/2012  Guideline(s) Used:  Neck Pain or Stiffness  Disposition Per Guideline:   See Within 3 Days in Office  Reason For Disposition Reached:   Moderate neck pain (e.g., interferes with normal activities like work or school)  Advice Given:  Apply Cold to the Area Or Heat:   During the first 2 days after a mild injury, apply a cold pack or an ice bag (wrapped in a towel) for 20 minutes four times a day. After 2 days, apply a heating pad or hot water bottle to the most painful area for 20 minutes whenever the pain flares up. Wrap hot water bottles or heating pads in a towel to avoid burns.  Sleep:  Sleep on your back or side, not on your abdomen.  Stretching Exercises:  After  48 hours of protecting the neck, begin gentle stretching exercises.  Pain Medicines:  For pain relief, you can take either acetaminophen, ibuprofen, or naproxen.  They are over-the-counter (OTC) pain drugs. You can buy them at the drugstore.  Good Body Mechanics:  Lifting: Stand close to the object to be lifted. Keep your back straight and lift by bending your legs. Ask for help if needed.  Sleeping: Sleep on a firm mattress.  Sitting: Avoid sitting for long periods of time without a break. Avoid slouching. Place a pillow or towel behind your lower back for support.  Computer screen: place at eye level.  Posture: Maintain good posture.  Call Back If:  Numbness or weakness occurs  Bowel or bladder problems occur  Pain lasts for more than 2 weeks  You become worse.  Patient Will Follow Care Advice:  YES  Appointment Scheduled:  12/25/2012 16:00:00 Appointment Scheduled Provider:  Eleonore Chiquito [facility])
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
## 2013-01-01 ENCOUNTER — Ambulatory Visit
Admission: RE | Admit: 2013-01-01 | Discharge: 2013-01-01 | Disposition: A | Discharge status: Home / Self Care | Payer: Commercial Managed Care - PPO | Source: Ambulatory Visit | Attending: Neurology | Admitting: Neurology

## 2013-01-01 DIAGNOSIS — D352 Benign neoplasm of pituitary gland: Secondary | ICD-10-CM

## 2013-01-01 MED ORDER — GADOBENATE DIMEGLUMINE 529 MG/ML IV SOLN
7.0000 mL | Freq: Once | INTRAVENOUS | Status: AC | PRN
  Administered 2013-01-01: 7 mL via INTRAVENOUS

## 2013-01-15 ENCOUNTER — Other Ambulatory Visit: Payer: Self-pay | Admitting: Internal Medicine

## 2013-02-10 ENCOUNTER — Other Ambulatory Visit: Payer: Self-pay | Admitting: Internal Medicine

## 2013-02-11 NOTE — Telephone Encounter (Signed)
Patient called for a refill of her vicodin. See previous request.

## 2013-02-12 ENCOUNTER — Telehealth: Payer: Self-pay | Admitting: Internal Medicine

## 2013-02-12 MED ORDER — HYDROCODONE-ACETAMINOPHEN 5-325 MG PO TABS: 1.0000 | ORAL_TABLET | Freq: Two times a day (BID) | ORAL | Status: DC | PRN

## 2013-02-12 NOTE — Telephone Encounter (Signed)
Pt saw Dr Kirtland Bouchard March 3 and was RX'd HYDROcodone-acetaminophen (NORCO/VICODIN) 5-325 MG per tablet for her neck pain. Dr Amador Cunas has advised pt any further refills to ask her PCP. (MD filled once w/ one refill).  [facility] [address]

## 2013-02-12 NOTE — Telephone Encounter (Signed)
Patient notified and she scheduled an appt.  [facility] [address]

## 2013-02-12 NOTE — Telephone Encounter (Signed)
Reviewed  record   Dr Clarisse Gouge advised getting off the narcotic s as much as possible cause it can cause headaches . And is not a good solution to her problem s..    Has had 120 pills since  3 /28  Visit .   Can refill 24  And appt  Before runs out.  Need update on headache management .  Did she go back for fu Dr Clarisse Gouge ? Please document what she says.  Thanks

## 2013-02-23 ENCOUNTER — Other Ambulatory Visit: Payer: Self-pay | Admitting: Internal Medicine

## 2013-03-09 ENCOUNTER — Ambulatory Visit (INDEPENDENT_AMBULATORY_CARE_PROVIDER_SITE_OTHER): Payer: Commercial Managed Care - PPO | Admitting: Internal Medicine

## 2013-03-09 ENCOUNTER — Encounter: Payer: Self-pay | Admitting: Internal Medicine

## 2013-03-09 VITALS — BP 104/74 | HR 100 | Temp 98.2°F | Wt 139.0 lb

## 2013-03-09 DIAGNOSIS — M542 Cervicalgia: Secondary | ICD-10-CM | POA: Insufficient documentation

## 2013-03-09 MED ORDER — HYDROCODONE-ACETAMINOPHEN 5-325 MG PO TABS: 1.0000 | ORAL_TABLET | Freq: Two times a day (BID) | ORAL | Status: DC | PRN
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
## 2013-03-09 NOTE — Patient Instructions (Signed)
Will do a referral to pain rehab about the neck pain  Management  Other options as we discussed .  Physical modalities  Other may help.   If you want to add flexeril at night we can try that in addition since your  Pain is worse in the am.   Contact us  If needed in the meantime  If needed for refill

## 2013-03-11 ENCOUNTER — Encounter: Payer: Self-pay | Admitting: Internal Medicine

## 2013-03-11 ENCOUNTER — Other Ambulatory Visit: Payer: Self-pay | Admitting: Internal Medicine

## 2013-03-11 MED ORDER — CYCLOBENZAPRINE HCL 10 MG PO TABS: ORAL_TABLET | ORAL | Status: DC

## 2013-03-24 ENCOUNTER — Encounter: Payer: Self-pay | Admitting: Internal Medicine

## 2013-03-25 ENCOUNTER — Telehealth: Payer: Self-pay | Admitting: Internal Medicine

## 2013-03-25 NOTE — Telephone Encounter (Signed)
Ok to refill x 1 because pt travel  Please give me update on status of pain clinic  Appt.

## 2013-03-26 ENCOUNTER — Encounter: Payer: Self-pay | Admitting: Internal Medicine

## 2013-03-26 ENCOUNTER — Other Ambulatory Visit: Payer: Self-pay | Admitting: *Deleted

## 2013-03-26 ENCOUNTER — Other Ambulatory Visit (INDEPENDENT_AMBULATORY_CARE_PROVIDER_SITE_OTHER): Payer: Self-pay

## 2013-03-26 MED ORDER — HYDROCODONE-ACETAMINOPHEN 5-325 MG PO TABS: 1.0000 | ORAL_TABLET | Freq: Two times a day (BID) | ORAL | Status: DC | PRN

## 2013-03-26 NOTE — Telephone Encounter (Signed)
Pt stated that the [facility] on [address] is requiring documentation from our office for an early refill. Please assist.

## 2013-03-26 NOTE — Telephone Encounter (Signed)
Okay for early refill per Dr Fabian Sharp.  Do Not Fill again before 05/07/13.

## 2013-04-05 ENCOUNTER — Encounter: Payer: Self-pay | Admitting: Physical Medicine & Rehabilitation

## 2013-04-20 ENCOUNTER — Other Ambulatory Visit: Payer: Self-pay | Admitting: Internal Medicine

## 2013-04-22 ENCOUNTER — Other Ambulatory Visit: Payer: Self-pay | Admitting: Internal Medicine

## 2013-04-23 ENCOUNTER — Other Ambulatory Visit: Payer: Self-pay | Admitting: Family Medicine

## 2013-04-23 MED ORDER — HYDROCODONE-ACETAMINOPHEN 5-325 MG PO TABS: 1.0000 | ORAL_TABLET | Freq: Two times a day (BID) | ORAL | Status: DC | PRN

## 2013-05-06 ENCOUNTER — Encounter: Payer: Self-pay | Admitting: Internal Medicine

## 2013-05-07 ENCOUNTER — Other Ambulatory Visit: Payer: Self-pay | Admitting: Family Medicine

## 2013-05-07 MED ORDER — HYDROCODONE-ACETAMINOPHEN 5-325 MG PO TABS: 1.0000 | ORAL_TABLET | Freq: Two times a day (BID) | ORAL | Status: DC | PRN

## 2013-05-20 ENCOUNTER — Encounter: Payer: Self-pay | Admitting: Internal Medicine

## 2013-05-21 ENCOUNTER — Other Ambulatory Visit: Payer: Self-pay | Admitting: Internal Medicine

## 2013-05-21 ENCOUNTER — Encounter: Payer: Commercial Managed Care - PPO | Attending: Physical Medicine & Rehabilitation

## 2013-05-21 ENCOUNTER — Encounter: Payer: Self-pay | Admitting: Internal Medicine

## 2013-05-21 ENCOUNTER — Ambulatory Visit (HOSPITAL_BASED_OUTPATIENT_CLINIC_OR_DEPARTMENT_OTHER): Payer: Commercial Managed Care - PPO | Admitting: Physical Medicine & Rehabilitation

## 2013-05-21 ENCOUNTER — Encounter: Payer: Self-pay | Admitting: Physical Medicine & Rehabilitation

## 2013-05-21 VITALS — BP 114/70 | HR 78 | Resp 14 | Ht 66.0 in | Wt 142.0 lb

## 2013-05-21 DIAGNOSIS — Z79899 Other long term (current) drug therapy: Secondary | ICD-10-CM

## 2013-05-21 DIAGNOSIS — M5030 Other cervical disc degeneration, unspecified cervical region: Secondary | ICD-10-CM

## 2013-05-21 DIAGNOSIS — M542 Cervicalgia: Secondary | ICD-10-CM | POA: Insufficient documentation

## 2013-05-21 DIAGNOSIS — M47812 Spondylosis without myelopathy or radiculopathy, cervical region: Secondary | ICD-10-CM

## 2013-05-21 DIAGNOSIS — Z5181 Encounter for therapeutic drug level monitoring: Secondary | ICD-10-CM

## 2013-05-21 DIAGNOSIS — F17200 Nicotine dependence, unspecified, uncomplicated: Secondary | ICD-10-CM | POA: Insufficient documentation

## 2013-05-21 MED ORDER — CYCLOBENZAPRINE HCL 10 MG PO TABS: ORAL_TABLET | ORAL | Status: DC

## 2013-05-21 MED ORDER — HYDROCODONE-ACETAMINOPHEN 5-325 MG PO TABS: 1.0000 | ORAL_TABLET | Freq: Two times a day (BID) | ORAL | Status: DC | PRN

## 2013-05-21 MED ORDER — IBUPROFEN 800 MG PO TABS: 800.0000 mg | ORAL_TABLET | Freq: Three times a day (TID) | ORAL | Status: DC | PRN
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
## 2013-05-21 NOTE — Patient Instructions (Signed)
He may get a 1-2 week supply of hydrocodone from your primary care physician while we await physical therapy as well as drug screen results.

## 2013-06-14 ENCOUNTER — Encounter: Payer: Commercial Managed Care - PPO | Attending: Physical Medicine & Rehabilitation

## 2013-06-14 ENCOUNTER — Ambulatory Visit: Payer: Commercial Managed Care - PPO | Admitting: Physical Medicine & Rehabilitation

## 2013-06-14 DIAGNOSIS — M542 Cervicalgia: Secondary | ICD-10-CM | POA: Insufficient documentation

## 2013-06-14 DIAGNOSIS — F17200 Nicotine dependence, unspecified, uncomplicated: Secondary | ICD-10-CM | POA: Insufficient documentation

## 2013-06-28 ENCOUNTER — Other Ambulatory Visit: Payer: Self-pay | Admitting: Internal Medicine

## 2013-06-29 ENCOUNTER — Encounter: Payer: Self-pay | Admitting: Internal Medicine

## 2013-06-30 ENCOUNTER — Telehealth: Payer: Self-pay | Admitting: Internal Medicine

## 2013-06-30 ENCOUNTER — Other Ambulatory Visit: Payer: Self-pay | Admitting: Family Medicine

## 2013-06-30 MED ORDER — HYDROCODONE-ACETAMINOPHEN 5-325 MG PO TABS: 1.0000 | ORAL_TABLET | Freq: Two times a day (BID) | ORAL | Status: DC | PRN

## 2013-06-30 NOTE — Telephone Encounter (Signed)
Pt returning your call

## 2013-06-30 NOTE — Telephone Encounter (Signed)
Pt notified to pick up rx at the front desk.

## 2013-07-05 NOTE — Telephone Encounter (Signed)
Pt was also told there would be no further refills of this medication from [facility].

## 2013-08-05 ENCOUNTER — Other Ambulatory Visit: Payer: Self-pay

## 2014-08-01 ENCOUNTER — Encounter: Payer: Self-pay | Admitting: Physical Medicine & Rehabilitation

## 2017-06-20 ENCOUNTER — Encounter: Payer: Self-pay | Admitting: Internal Medicine

## 2018-03-04 ENCOUNTER — Emergency Department (HOSPITAL_COMMUNITY): Payer: Self-pay

## 2018-03-04 ENCOUNTER — Emergency Department (HOSPITAL_COMMUNITY)
Admission: EM | Admit: 2018-03-04 | Discharge: 2018-03-04 | Disposition: A | Discharge status: Home / Self Care | Payer: Self-pay | Attending: Emergency Medicine | Admitting: Emergency Medicine

## 2018-03-04 ENCOUNTER — Encounter (HOSPITAL_COMMUNITY): Payer: Self-pay | Admitting: Emergency Medicine

## 2018-03-04 DIAGNOSIS — Z79899 Other long term (current) drug therapy: Secondary | ICD-10-CM | POA: Insufficient documentation

## 2018-03-04 DIAGNOSIS — Y999 Unspecified external cause status: Secondary | ICD-10-CM | POA: Insufficient documentation

## 2018-03-04 DIAGNOSIS — Y929 Unspecified place or not applicable: Secondary | ICD-10-CM | POA: Insufficient documentation

## 2018-03-04 DIAGNOSIS — F17210 Nicotine dependence, cigarettes, uncomplicated: Secondary | ICD-10-CM | POA: Insufficient documentation

## 2018-03-04 DIAGNOSIS — E079 Disorder of thyroid, unspecified: Secondary | ICD-10-CM | POA: Insufficient documentation

## 2018-03-04 DIAGNOSIS — S62621A Displaced fracture of medial phalanx of left index finger, initial encounter for closed fracture: Secondary | ICD-10-CM | POA: Insufficient documentation

## 2018-03-04 DIAGNOSIS — X58XXXA Exposure to other specified factors, initial encounter: Secondary | ICD-10-CM | POA: Insufficient documentation

## 2018-03-04 DIAGNOSIS — Y939 Activity, unspecified: Secondary | ICD-10-CM | POA: Insufficient documentation

## 2018-03-04 MED ORDER — TRAMADOL HCL 50 MG PO TABS: 50.0000 mg | ORAL_TABLET | Freq: Four times a day (QID) | ORAL | 0 refills | Status: DC | PRN

## 2018-03-04 MED ORDER — LIDOCAINE HCL 2 % IJ SOLN
20.0000 mL | Freq: Once | INTRAMUSCULAR | Status: AC
  Administered 2018-03-04: 400 mg
  Filled 2018-03-04: qty 20

## 2018-03-04 NOTE — ED Triage Notes (Signed)
Patient from home with complaint of right little finger pain and bruising after getting her finger pulled by a rope tied to a horse. Skin intact. Bruising from knuckle to most distal joint.

## 2018-03-04 NOTE — Consult Note (Signed)
Reason for Consult:Right 5th middle phalanx fx Referring Physician: Robyn Haber  [name] is an 47 y.o. female.  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 The horse reared and stripped the leader from her hand. He had immediate pain and a deformity to the little finger. She came to the ED for evaluation and x-rays showed a middle phalanx fx and hand surgery was consulted. 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  Past Medical History:  Diagnosis Date  . 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  full visual  fields   . Headache(784.0)   . Insomnia   . S/P breast biopsy, right    open  . Thyroid adenoma 2006   ? was on medication at some point    Past Surgical History:  Procedure Laterality Date  . CESAREAN SECTION  2005  . MASTECTOMY  2012   open large biopsy microcalcifications no cancer   . TUBAL LIGATION  2013    Family History  Problem Relation Age of Onset  . Thyroid disease Mother   . Breast cancer Unknown        maternal aunts     Social History:  reports that she has been smoking.  She has never used smokeless tobacco. Her alcohol and drug histories are not on file.  Allergies:  Allergies  Allergen Reactions  . Ciprofloxacin     Medications: I have reviewed the patient's current medications.  No results found for this or any previous visit (from the past 48 hour(s)).  Dg Hand Complete Right  Result Date: 03/04/2018 CLINICAL DATA:  Right little finger pain, bruising EXAM: RIGHT HAND - COMPLETE 3+ VIEW COMPARISON:  None. FINDINGS: Mildly displaced fracture noted through the midportion of the right 5th digit middle phalanx. No subluxation or dislocation. No visible intra-articular extension. IMPRESSION: Mildly displaced fracture through the right 5th middle phalanx. Electronically Signed   By: Rolm Baptise M.D.   On: 03/04/2018 13:34    Review of Systems  Constitutional: Negative for weight loss.  HENT: Negative  for ear discharge, ear pain, hearing loss and tinnitus.   Eyes: Negative for blurred vision, double vision, photophobia and pain.  Respiratory: Negative for cough, sputum production and shortness of breath.   Cardiovascular: Negative for chest pain.  Gastrointestinal: Negative for abdominal pain, nausea and vomiting.  Genitourinary: Negative for dysuria, flank pain, frequency and urgency.  Musculoskeletal: Positive for joint pain (Right little finger). Negative for back pain, falls, myalgias and neck pain.  Neurological: Negative for dizziness, tingling, sensory change, focal weakness, loss of consciousness and headaches.  Endo/Heme/Allergies: Does not bruise/bleed easily.  Psychiatric/Behavioral: Negative for depression, memory loss and substance abuse. The patient is not nervous/anxious.    Blood pressure 129/86, pulse 72, temperature 98.1 F (36.7 C), temperature source Oral, resp. rate 16, SpO2 100 %. Physical Exam  Constitutional: She appears well-developed and well-nourished. No distress.  HENT:  Head: Normocephalic and atraumatic.  Eyes: Conjunctivae are normal. Right eye exhibits no discharge. Left eye exhibits no discharge. No scleral icterus.  Neck: Normal range of motion.  Cardiovascular: Normal rate and regular rhythm.  Respiratory: Effort normal. No respiratory distress.  Musculoskeletal:  Right shoulder, elbow, wrist, digits- no skin wounds, TTP little finger, mild fusiform edema middle phalanx with mild ecchymosis  Sens  Ax/R/M/U intact  Mot   Ax/ R/ PIN/ M/ AIN/ U intact  Rad 2+  Neurological: She  is alert.  Skin: Skin is warm and dry. She is not diaphoretic.  Psychiatric: She has a normal mood and affect. Her behavior is normal.    Assessment/Plan: Right little finger middle phalanx fx  -- Straighten and splint. She should f/u with Dr. Caralyn Guile in 2-3 weeks.    Lisette Abu, PA-C Orthopedic Surgery [phone] 03/04/2018, 3:20 PM

## 2018-03-04 NOTE — ED Notes (Signed)
ED Provider at bedside. 

## 2018-03-04 NOTE — ED Notes (Signed)
Pt verbalizes understanding of d/c instructions. Pt received prescriptions. Pt ambulatory at d/c with all belongings.  

## 2018-03-04 NOTE — Discharge Instructions (Signed)
Please read attached information. If you experience any new or worsening signs or symptoms please return to the emergency room for evaluation. Please follow-up with your primary care provider or specialist as discussed. Please use medication prescribed only as directed and discontinue taking if you have any concerning signs or symptoms.   °

## 2018-03-04 NOTE — ED Provider Notes (Signed)
[facility] EMERGENCY DEPARTMENT Provider Note   CSN: [id number] Arrival date & time: 03/04/18  1252     History   Chief Complaint Chief Complaint  Patient presents with  . Hand Pain    HPI [name] is a 47 y.o. female.  HPI   46 year old female presents today with complaints of left knee pain.  Patient notes she was holding a rope when the horse took off causing injury to the right pinky.  She notes swelling at the middle phalanx with bruising.  She notes decreased range of motion with difficulty extending the finger.  Patient denies any other injuries, no medications prior to arrival.  Past Medical History:  Diagnosis Date  . 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  full visual  fields   . Headache(784.0)   . Insomnia   . S/P breast biopsy, right    open  . Thyroid adenoma 2006   ? was on medication at some point    Patient Active Problem List   Diagnosis Date Noted  . Cervical pain (neck) 03/09/2013  . Headache(784.0) 12/08/2012  . Acute reaction to stress 12/08/2012  . Neck pain, musculoskeletal 12/08/2012  . Smoking 1/2 pack a day or less 12/08/2012  . Hx of thyroid disease 12/08/2012    Past Surgical History:  Procedure Laterality Date  . CESAREAN SECTION  2005  . MASTECTOMY  2012   open large biopsy microcalcifications no cancer   . TUBAL LIGATION  2013     OB History    Gravida  3   Para  2   Term      Preterm      AB      Living        SAB      TAB      Ectopic      Multiple      Live Births               Home Medications    Prior to Admission medications   Medication Sig Start Date End Date Taking? Authorizing Provider  cyclobenzaprine (FLEXERIL) 10 MG tablet Take 1/2 to 1 hs as needed 05/21/13   Kirsteins, Luanna Salk, MD  HYDROcodone-acetaminophen (NORCO/VICODIN) 5-325 MG per tablet Take 1 tablet by mouth 2 (two) times daily as needed for pain. 06/30/13   Panosh, Standley Brooking, MD  ibuprofen  (ADVIL,MOTRIN) 800 MG tablet Take 1 tablet (800 mg total) by mouth every 8 (eight) hours as needed for pain. 05/21/13   Kirsteins, Luanna Salk, MD  traMADol (ULTRAM) 50 MG tablet Take 1 tablet (50 mg total) by mouth every 6 (six) hours as needed. 03/04/18   Okey Regal, PA-C    Family History Family History  Problem Relation Age of Onset  . Thyroid disease Mother   . Breast cancer Unknown        maternal aunts     Social History Social History   Tobacco Use  . Smoking status: Current Every Day Smoker  . Smokeless tobacco: Never Used  Substance Use Topics  . Alcohol use: Not on file  . Drug use: Not on file     Allergies   Ciprofloxacin   Review of Systems Review of Systems  All other systems reviewed and are negative.   Physical Exam Updated Vital Signs BP 129/86 (BP Location: Right Arm)   Pulse 72   Temp 98.1 F (36.7 C) (Oral)   Resp 16  SpO2 100%   Physical Exam  Constitutional: She is oriented to person, place, and time. She appears well-developed and well-nourished.  HENT:  Head: Normocephalic and atraumatic.  Eyes: Pupils are equal, round, and reactive to light. Conjunctivae are normal. Right eye exhibits no discharge. Left eye exhibits no discharge. No scleral icterus.  Neck: Normal range of motion. No JVD present. No tracheal deviation present.  Pulmonary/Chest: Effort normal. No stridor.  Musculoskeletal:  Bruising noted to left 5th finger- difficulty with extension at the PIP- sensation intact - no open wounds   Neurological: She is alert and oriented to person, place, and time. Coordination normal.  Psychiatric: She has a normal mood and affect. Her behavior is normal. Judgment and thought content normal.  Nursing note and vitals reviewed.   ED Treatments / Results  Labs (all labs ordered are listed, but only abnormal results are displayed) Labs Reviewed - No data to display  EKG None  Radiology Dg Hand Complete Right  Result Date:  03/04/2018 CLINICAL DATA:  Right little finger pain, bruising EXAM: RIGHT HAND - COMPLETE 3+ VIEW COMPARISON:  None. FINDINGS: Mildly displaced fracture noted through the midportion of the right 5th digit middle phalanx. No subluxation or dislocation. No visible intra-articular extension. IMPRESSION: Mildly displaced fracture through the right 5th middle phalanx. Electronically Signed   By: Rolm Baptise M.D.   On: 03/04/2018 13:34    Procedures Procedures (including critical care time)  NERVE BLOCK Performed by: Stevie Kern Dayshia Ballinas Consent: Verbal consent obtained. Required items: required blood products, implants, devices, and special equipment available Time out: Immediately prior to procedure a "time out" was called to verify the correct patient, procedure, equipment, support staff and site/side marked as required.  Indication: Pain Nerve block body site: Right pinky  Preparation: Patient was prepped and draped in the usual sterile fashion. Needle gauge: 24 G Location technique: anatomical landmarks  Local anesthetic: 2% lidocaine  Anesthetic total: 4 ml  Outcome: pain improved Patient tolerance: Patient tolerated the procedure well with no immediate complications.  Medications Ordered in ED Medications  lidocaine (XYLOCAINE) 2 % (with pres) injection 400 mg (400 mg Infiltration Given by Other 03/04/18 1522)     Initial Impression / Assessment and Plan / ED Course  I have reviewed the triage vital signs and the nursing notes.  Pertinent labs & imaging results that were available during my care of the patient were reviewed by me and considered in my medical decision making (see chart for details).     Labs:   Imaging: DG hand complete   Consults:  Therapeutics:  Discharge Meds: ultram  Assessment/Plan: 1 YOF presents today with complaint of finger injury-patient has middle phalanx fracture with mild displacement.  Patient care was discussed with on-call hand  specialty.  Recommend sedation for outpatient follow-up after splinting.  Digital block was performed for comfort and splint placement.  Patient has good anatomical alignment.  She will be discharged with outpatient follow-up and strict return precautions.  She verbalized understanding and agreement to today's plan.   Final Clinical Impressions(s) / ED Diagnoses   Final diagnoses:  Displaced fracture of middle phalanx of left index finger, initial encounter for closed fracture    ED Discharge Orders        Ordered    traMADol (ULTRAM) 50 MG tablet  Every 6 hours PRN     03/04/18 1538       Okey Regal, PA-C 03/04/18 1539    Davonna Belling, MD 03/04/18 2201

## 2018-03-08 ENCOUNTER — Emergency Department (HOSPITAL_COMMUNITY)
Admission: EM | Admit: 2018-03-08 | Discharge: 2018-03-08 | Disposition: A | Discharge status: Home / Self Care | Payer: Self-pay | Attending: Emergency Medicine | Admitting: Emergency Medicine

## 2018-03-08 ENCOUNTER — Encounter (HOSPITAL_COMMUNITY): Payer: Self-pay | Admitting: Emergency Medicine

## 2018-03-08 ENCOUNTER — Emergency Department (HOSPITAL_COMMUNITY): Payer: Self-pay

## 2018-03-08 DIAGNOSIS — S6992XD Unspecified injury of left wrist, hand and finger(s), subsequent encounter: Secondary | ICD-10-CM | POA: Insufficient documentation

## 2018-03-08 DIAGNOSIS — S92401A Displaced unspecified fracture of right great toe, initial encounter for closed fracture: Secondary | ICD-10-CM

## 2018-03-08 DIAGNOSIS — F17200 Nicotine dependence, unspecified, uncomplicated: Secondary | ICD-10-CM | POA: Insufficient documentation

## 2018-03-08 DIAGNOSIS — X58XXXD Exposure to other specified factors, subsequent encounter: Secondary | ICD-10-CM | POA: Insufficient documentation

## 2018-03-08 DIAGNOSIS — E079 Disorder of thyroid, unspecified: Secondary | ICD-10-CM | POA: Insufficient documentation

## 2018-03-08 NOTE — ED Triage Notes (Signed)
Pt states she was given a prescription for tramadol, but did not fill it and has been taking ibuprofen

## 2018-03-08 NOTE — ED Triage Notes (Signed)
Patient presents to ED for assessment after being diagnosed with a broken pinky.  States pain is worsening, now radiates down into her palm.  States she has been using her hand a lot at work, and may have re-injured it.  Radial pulse intact.

## 2018-03-08 NOTE — ED Provider Notes (Signed)
[facility] EMERGENCY DEPARTMENT Provider Note   CSN: [id number] Arrival date & time: 03/08/18  1327     History   Chief Complaint Chief Complaint  Patient presents with  . Hand Pain    HPI [name] is a 47 y.o. female.  Pt comes in with c/o worsening pain to her left pinky finger. She was seen on 6/5 and diagnosed with fracture. 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 She said the ultram and ibuprofen are not working     Past Medical History:  Diagnosis Date  . 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  full visual  fields   . Headache(784.0)   . Insomnia   . S/P breast biopsy, right    open  . Thyroid adenoma 2006   ? was on medication at some point    Patient Active Problem List   Diagnosis Date Noted  . Cervical pain (neck) 03/09/2013  . Headache(784.0) 12/08/2012  . Acute reaction to stress 12/08/2012  . Neck pain, musculoskeletal 12/08/2012  . Smoking 1/2 pack a day or less 12/08/2012  . Hx of thyroid disease 12/08/2012    Past Surgical History:  Procedure Laterality Date  . CESAREAN SECTION  2005  . MASTECTOMY  2012   open large biopsy microcalcifications no cancer   . TUBAL LIGATION  2013     OB History    Gravida  3   Para  2   Term      Preterm      AB      Living        SAB      TAB      Ectopic      Multiple      Live Births               Home Medications    Prior to Admission medications   Medication Sig Start Date End Date Taking? Authorizing Provider  cyclobenzaprine (FLEXERIL) 10 MG tablet Take 1/2 to 1 hs as needed 05/21/13   Kirsteins, Luanna Salk, MD  HYDROcodone-acetaminophen (NORCO/VICODIN) 5-325 MG per tablet Take 1 tablet by mouth 2 (two) times daily as needed for pain. 06/30/13   Panosh, Standley Brooking, MD  ibuprofen (ADVIL,MOTRIN) 800 MG tablet Take 1 tablet (800 mg total) by mouth every 8 (eight) hours as needed for pain.  05/21/13   Kirsteins, Luanna Salk, MD  traMADol (ULTRAM) 50 MG tablet Take 1 tablet (50 mg total) by mouth every 6 (six) hours as needed. 03/04/18   Okey Regal, PA-C    Family History Family History  Problem Relation Age of Onset  . Thyroid disease Mother   . Breast cancer Unknown        maternal aunts     Social History Social History   Tobacco Use  . Smoking status: Current Every Day Smoker  . Smokeless tobacco: Never Used  Substance Use Topics  . Alcohol use: Not on file  . Drug use: Not on file     Allergies   Ciprofloxacin   Review of Systems Review of Systems  All other systems reviewed and are negative.    Physical Exam Updated Vital Signs BP 119/74 (BP Location: Right Arm)   Pulse 67   Temp 98.2 F (36.8 C) (Oral)   Resp 18   SpO2 98%   Physical Exam  Constitutional: She is oriented  to person, place, and time. She appears well-developed and well-nourished.  Cardiovascular: Normal rate and regular rhythm.  Pulmonary/Chest: Effort normal and breath sounds normal.  Musculoskeletal:  Mild generalized swelling noted to the left pinky finger. No gross deformity noted  Neurological: She is alert and oriented to person, place, and time.  Skin: Skin is warm.  Bruising noted to finger  Psychiatric: She has a normal mood and affect.  Nursing note and vitals reviewed.    ED Treatments / Results  Labs (all labs ordered are listed, but only abnormal results are displayed) Labs Reviewed - No data to display  EKG None  Radiology Dg Hand Complete Right  Result Date: 03/08/2018 CLINICAL DATA:  Recent right fifth finger fracture. Possible re-injury EXAM: RIGHT HAND - COMPLETE 3+ VIEW COMPARISON:  03/04/2018 right hand radiographs FINDINGS: Oblique non articular shaft fracture in the middle phalanx right fifth finger with mild overriding and mild 2 mm volar displacement of the distal fracture fragment, unchanged, without evidence of healing response on this early  interval study. Soft tissue swelling surrounding the fracture site. No new fracture. No dislocation. No significant arthropathy. No suspicious focal osseous lesions. IMPRESSION: Unchanged mildly displaced and mildly overriding non articular shaft fracture in the middle phalanx right fifth finger, without appreciable healing response on this early interval study. Electronically Signed   By: Ilona Sorrel M.D.   On: 03/08/2018 14:52    Procedures Procedures (including critical care time)  Medications Ordered in ED Medications - No data to display   Initial Impression / Assessment and Plan / ED Course  I have reviewed the triage vital signs and the nursing notes.  Pertinent labs & imaging results that were available during my care of the patient were reviewed by me and considered in my medical decision making (see chart for details).     Will replace splint. No new injury  Final Clinical Impressions(s) / ED Diagnoses   Final diagnoses:  None    ED Discharge Orders    None       Glendell Docker, NP 03/08/18 [place], MD 03/08/18 [phone]

## 2018-03-08 NOTE — ED Notes (Signed)
Patient verbalizes understanding of discharge instructions. Opportunity for questioning and answers were provided. Armband removed by staff, pt discharged from ED ambulatory.   

## 2020-09-23 ENCOUNTER — Other Ambulatory Visit: Payer: Self-pay

## 2020-09-23 ENCOUNTER — Emergency Department (HOSPITAL_COMMUNITY)
Admission: EM | Admit: 2020-09-23 | Discharge: 2020-09-23 | Disposition: A | Discharge status: Home / Self Care | Payer: Self-pay | Attending: Emergency Medicine | Admitting: Emergency Medicine

## 2020-09-23 ENCOUNTER — Encounter (HOSPITAL_COMMUNITY): Payer: Self-pay | Admitting: *Deleted

## 2020-09-23 DIAGNOSIS — F17210 Nicotine dependence, cigarettes, uncomplicated: Secondary | ICD-10-CM | POA: Insufficient documentation

## 2020-09-23 DIAGNOSIS — R1013 Epigastric pain: Secondary | ICD-10-CM | POA: Insufficient documentation

## 2020-09-23 DIAGNOSIS — R112 Nausea with vomiting, unspecified: Secondary | ICD-10-CM | POA: Insufficient documentation

## 2020-09-23 LAB — URINALYSIS, ROUTINE W REFLEX MICROSCOPIC
Bacteria, UA: NONE SEEN
Bilirubin Urine: NEGATIVE
Glucose, UA: NEGATIVE mg/dL
Ketones, ur: NEGATIVE mg/dL
Leukocytes,Ua: NEGATIVE
Nitrite: NEGATIVE
Protein, ur: NEGATIVE mg/dL
Specific Gravity, Urine: 1.011 (ref 1.005–1.030)
pH: 6 (ref 5.0–8.0)

## 2020-09-23 LAB — COMPREHENSIVE METABOLIC PANEL
ALT: 12 U/L (ref 0–44)
AST: 20 U/L (ref 15–41)
Albumin: 4.6 g/dL (ref 3.5–5.0)
Alkaline Phosphatase: 65 U/L (ref 38–126)
Anion gap: 9 (ref 5–15)
BUN: 20 mg/dL (ref 6–20)
CO2: 26 mmol/L (ref 22–32)
Calcium: 9.5 mg/dL (ref 8.9–10.3)
Chloride: 105 mmol/L (ref 98–111)
Creatinine, Ser: 1.01 mg/dL — ABNORMAL HIGH (ref 0.44–1.00)
GFR, Estimated: >60 mL/min (ref >60)
Glucose, Bld: 105 mg/dL — ABNORMAL HIGH (ref 70–99)
Potassium: 3.9 mmol/L (ref 3.5–5.1)
Sodium: 140 mmol/L (ref 135–145)
Total Bilirubin: 0.7 mg/dL (ref 0.3–1.2)
Total Protein: 7.5 g/dL (ref 6.5–8.1)

## 2020-09-23 LAB — CBC WITH DIFFERENTIAL/PLATELET
Abs Immature Granulocytes: 0.04 10*3/uL (ref 0.00–0.07)
Basophils Absolute: 0 10*3/uL (ref 0.0–0.1)
Basophils Relative: 0 %
Eosinophils Absolute: 0.4 10*3/uL (ref 0.0–0.5)
Eosinophils Relative: 5 %
HCT: 42.2 % (ref 36.0–46.0)
Hemoglobin: 14 g/dL (ref 12.0–15.0)
Immature Granulocytes: 0 %
Lymphocytes Relative: 23 %
Lymphs Abs: 2.1 10*3/uL (ref 0.7–4.0)
MCH: 30 pg (ref 26.0–34.0)
MCHC: 33.2 g/dL (ref 30.0–36.0)
MCV: 90.6 fL (ref 80.0–100.0)
Monocytes Absolute: 0.5 10*3/uL (ref 0.1–1.0)
Monocytes Relative: 5 %
Neutro Abs: 6 10*3/uL (ref 1.7–7.7)
Neutrophils Relative %: 67 %
Platelets: 243 10*3/uL (ref 150–400)
RBC: 4.66 MIL/uL (ref 3.87–5.11)
RDW: 13 % (ref 11.5–15.5)
WBC: 9 10*3/uL (ref 4.0–10.5)
nRBC: 0 % (ref 0.0–0.2)

## 2020-09-23 LAB — LIPASE, BLOOD: Lipase: 26 U/L (ref 11–51)

## 2020-09-23 MED ORDER — ONDANSETRON HCL 4 MG/2ML IJ SOLN
4.0000 mg | Freq: Once | INTRAMUSCULAR | Status: AC
  Administered 2020-09-23: 14:00:00 4 mg via INTRAVENOUS
  Filled 2020-09-23: qty 2

## 2020-09-23 MED ORDER — ALUM & MAG HYDROXIDE-SIMETH 200-200-20 MG/5ML PO SUSP
30.0000 mL | Freq: Once | ORAL | Status: AC
  Administered 2020-09-23: 15:00:00 30 mL via ORAL
  Filled 2020-09-23: qty 30

## 2020-09-23 MED ORDER — ONDANSETRON HCL 4 MG PO TABS: 4.0000 mg | ORAL_TABLET | Freq: Three times a day (TID) | ORAL | 0 refills | Status: AC | PRN

## 2020-09-23 MED ORDER — LIDOCAINE VISCOUS HCL 2 % MT SOLN
15.0000 mL | Freq: Once | OROMUCOSAL | Status: AC
  Administered 2020-09-23: 15:00:00 15 mL via ORAL
  Filled 2020-09-23: qty 15

## 2020-09-23 MED ORDER — PROMETHAZINE HCL 25 MG/ML IJ SOLN
25.0000 mg | INTRAMUSCULAR | Status: DC | PRN
  Administered 2020-09-23: 15:00:00 25 mg via INTRAVENOUS
  Filled 2020-09-23: qty 1

## 2020-09-23 MED ORDER — SODIUM CHLORIDE 0.9 % IV BOLUS
1000.0000 mL | Freq: Once | INTRAVENOUS | Status: AC
  Administered 2020-09-23: 15:00:00 1000 mL via INTRAVENOUS

## 2020-09-23 MED ORDER — OMEPRAZOLE 20 MG PO CPDR: 20.0000 mg | DELAYED_RELEASE_CAPSULE | Freq: Two times a day (BID) | ORAL | 0 refills | Status: AC

## 2020-09-23 NOTE — ED Notes (Signed)
Pt able to tolerate PO fluids without difficulty.  

## 2020-09-23 NOTE — ED Notes (Signed)
Pt given ginger ale for PO fluid challenge.  

## 2020-09-23 NOTE — ED Provider Notes (Signed)
[facility] DEPT Provider Note   CSN: [id number] Arrival date & time: 09/23/20  1245     History Chief Complaint  Patient presents with  . Abdominal Pain    [name] is a 49 y.o. female.  The history is provided by the patient and medical records. No language interpreter was used.  Abdominal Pain    49 year old female presenting for evaluation of abdominal pain.  Patient report for the past 4 days she has had recurrent epigastric abdominal discomfort in which she described as a gnawing burning sensation radiates towards chest and throat.  She endorsed nauseous, had several episodes of nonbloody nonbilious vomiting with normal bowel movement.  The symptom has been waxing waning brought on by eating.  Pain is minimal at this time.  No associated fever chills no runny nose sneezing coughing sore throat loss of taste or smell dysuria or rash.  She has been fully vaccinated for COVID-19 including having her booster several days prior.  She had a normal bowel movement today.  She did try taking over-the-counter medication for heartburn without adequate relief.  She has had similar epigastric pain throughout the year today and was on Nexium.  She is a smoker but denies alcohol abuse.  She does not complain of exertional chest pain or shortness of breath  Past Medical History:  Diagnosis Date  . 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  full visual  fields   . Headache(784.0)   . Insomnia   . S/P breast biopsy, right    open  . Thyroid adenoma 2006   ? was on medication at some point    Patient Active Problem List   Diagnosis Date Noted  . Cervical pain (neck) 03/09/2013  . Headache(784.0) 12/08/2012  . Acute reaction to stress 12/08/2012  . Neck pain, musculoskeletal 12/08/2012  . Smoking 1/2 pack a day or less 12/08/2012  . Hx of thyroid disease 12/08/2012    Past Surgical History:  Procedure Laterality Date  . CESAREAN SECTION  2005   . MASTECTOMY  2012   open large biopsy microcalcifications no cancer   . TUBAL LIGATION  2013     OB History    Gravida  3   Para  2   Term      Preterm      AB      Living        SAB      IAB      Ectopic      Multiple      Live Births              Family History  Problem Relation Age of Onset  . Thyroid disease Mother   . Breast cancer Other        maternal aunts     Social History   Tobacco Use  . Smoking status: Current Every Day Smoker    Packs/day: 0.50    Types: Cigarettes  . Smokeless tobacco: Never Used  Vaping Use  . Vaping Use: Never used  Substance Use Topics  . Alcohol use: Not Currently  . Drug use: Never    Home Medications Prior to Admission medications   Medication Sig Start Date End Date Taking? Authorizing Provider  cyclobenzaprine (FLEXERIL) 10 MG tablet Take 1/2 to 1 hs as needed 05/21/13   Kirsteins, Luanna Salk, MD  HYDROcodone-acetaminophen (NORCO/VICODIN) 5-325 MG per tablet Take 1 tablet by mouth 2 (two) times daily  as needed for pain. 06/30/13   Panosh, Standley Brooking, MD  ibuprofen (ADVIL,MOTRIN) 800 MG tablet Take 1 tablet (800 mg total) by mouth every 8 (eight) hours as needed for pain. 05/21/13   Kirsteins, Luanna Salk, MD  traMADol (ULTRAM) 50 MG tablet Take 1 tablet (50 mg total) by mouth every 6 (six) hours as needed. 03/04/18   Hedges, Dellis Filbert, PA-C    Allergies    Ciprofloxacin  Review of Systems   Review of Systems  Gastrointestinal: Positive for abdominal pain.  All other systems reviewed and are negative.   Physical Exam Updated Vital Signs BP 118/83 (BP Location: Left Arm)   Pulse 78   Temp 98.9 F (37.2 C) (Oral)   Resp 20   Ht 5\' 6"  (1.676 m)   Wt 63.5 kg   LMP 09/23/2016   SpO2 99%   BMI 22.60 kg/m   Physical Exam Vitals and nursing note reviewed.  Constitutional:      General: She is not in acute distress.    Appearance: She is well-developed and well-nourished.  HENT:     Head: Atraumatic.   Eyes:     Conjunctiva/sclera: Conjunctivae normal.  Cardiovascular:     Rate and Rhythm: Normal rate and regular rhythm.     Heart sounds: Normal heart sounds.  Pulmonary:     Effort: Pulmonary effort is normal.     Breath sounds: Normal breath sounds. No wheezing, rhonchi or rales.  Abdominal:     General: Abdomen is flat. Bowel sounds are normal.     Palpations: Abdomen is soft.     Tenderness: There is abdominal tenderness in the epigastric area.     Hernia: No hernia is present.  Musculoskeletal:     Cervical back: Neck supple.  Skin:    Findings: No rash.  Neurological:     Mental Status: She is alert.  Psychiatric:        Mood and Affect: Mood and affect and mood normal.     ED Results / Procedures / Treatments   Labs (all labs ordered are listed, but only abnormal results are displayed) Labs Reviewed  COMPREHENSIVE METABOLIC PANEL - Abnormal; Notable for the following components:      Result Value   Glucose, Bld 105 (*)    Creatinine, Ser 1.01 (*)    All other components within normal limits  CBC WITH DIFFERENTIAL/PLATELET  LIPASE, BLOOD  URINALYSIS, ROUTINE W REFLEX MICROSCOPIC    EKG None   Date: 09/23/2020  Rate: 64  Rhythm: normal sinus rhythm  QRS Axis: normal  Intervals: normal  ST/T Wave abnormalities: normal  Conduction Disutrbances: none  Narrative Interpretation: borderline short PR interval  Old EKG Reviewed: No significant changes noted EKG reviewed and interpreted by me    Radiology No results found.  Procedures Procedures (including critical care time)  Medications Ordered in ED Medications  promethazine (PHENERGAN) injection 25 mg (has no administration in time range)  sodium chloride 0.9 % bolus 1,000 mL (has no administration in time range)  ondansetron (ZOFRAN) injection 4 mg (4 mg Intravenous Given 09/23/20 1352)  alum & mag hydroxide-simeth (MAALOX/MYLANTA) 200-200-20 MG/5ML suspension 30 mL (30 mLs Oral Given 09/23/20 1433)     And  lidocaine (XYLOCAINE) 2 % viscous mouth solution 15 mL (15 mLs Oral Given 09/23/20 1432)    ED Course  I have reviewed the triage vital signs and the nursing notes.  Pertinent labs & imaging results that were available during my care of the  patient were reviewed by me and considered in my medical decision making (see chart for details).    MDM Rules/Calculators/A&P                          BP 110/81   Pulse (!) 54   Temp 98.9 F (37.2 C) (Oral)   Resp 15   Ht 5\' 6"  (1.676 m)   Wt 63.5 kg   LMP 09/23/2016   SpO2 98%   BMI 22.60 kg/m   Final Clinical Impression(s) / ED Diagnoses Final diagnoses:  Epigastric pain  Non-intractable vomiting with nausea, unspecified vomiting type    Rx / DC Orders ED Discharge Orders    None     1:39 PM Patient here with epigastric abdominal pain associate nausea and vomiting and postprandial pain.  She does have some mild epigastric tenderness but no right upper quadrant tenderness to suggest, bladder etiology.  No right lower quadrant tenderness to suggest appendicitis.  She is a smoker and given her presentation, labs ordered, EKG ordered, GI cocktail given as well as antinausea medication.  Patient otherwise well-appearing  2:47 PM Labs are reassuring.  EKG without concerning feature.  Patient received Zofran and later on she received a GI cocktail but felt sick after GI cocktail.  Phenergan ordered.  She is currently receiving IV fluid  3:14 PM Pt sign out to oncoming provider who will d/c pt pending IVF and PO challenge.    Domenic Moras, PA-C 09/23/20 [place], [place], DO 09/23/20 1547

## 2020-09-23 NOTE — Discharge Instructions (Addendum)
Please eat a bland diet for the next few days.  Please make sure you are drinking plenty of water.

## 2020-09-23 NOTE — ED Triage Notes (Signed)
Pt reports that Tuesday pt had abd pain, n/v. The vomiting has resolved but pt continues to have abd pain and nausea.  Pt states she has been able to tolerate PO's and reports a normal BM today.  Pt a/o x4 and ambulatory.

## 2021-03-27 DIAGNOSIS — R1011 Right upper quadrant pain: Secondary | ICD-10-CM | POA: Diagnosis not present

## 2021-04-09 DIAGNOSIS — R1011 Right upper quadrant pain: Secondary | ICD-10-CM | POA: Diagnosis not present

## 2021-04-24 DIAGNOSIS — Z01810 Encounter for preprocedural cardiovascular examination: Secondary | ICD-10-CM | POA: Diagnosis not present

## 2021-04-24 DIAGNOSIS — Z01812 Encounter for preprocedural laboratory examination: Secondary | ICD-10-CM | POA: Diagnosis not present

## 2021-04-24 DIAGNOSIS — K828 Other specified diseases of gallbladder: Secondary | ICD-10-CM | POA: Diagnosis not present

## 2021-04-25 DIAGNOSIS — R1011 Right upper quadrant pain: Secondary | ICD-10-CM | POA: Diagnosis not present

## 2021-04-25 DIAGNOSIS — K429 Umbilical hernia without obstruction or gangrene: Secondary | ICD-10-CM | POA: Diagnosis not present

## 2021-04-25 DIAGNOSIS — K8050 Calculus of bile duct without cholangitis or cholecystitis without obstruction: Secondary | ICD-10-CM | POA: Diagnosis not present

## 2021-04-25 DIAGNOSIS — K660 Peritoneal adhesions (postprocedural) (postinfection): Secondary | ICD-10-CM | POA: Diagnosis not present

## 2022-03-12 ENCOUNTER — Other Ambulatory Visit: Payer: Self-pay

## 2022-03-12 ENCOUNTER — Encounter (HOSPITAL_BASED_OUTPATIENT_CLINIC_OR_DEPARTMENT_OTHER): Payer: Self-pay

## 2022-03-12 ENCOUNTER — Emergency Department (HOSPITAL_BASED_OUTPATIENT_CLINIC_OR_DEPARTMENT_OTHER): Payer: Medicaid Other | Admitting: Radiology

## 2022-03-12 ENCOUNTER — Emergency Department (HOSPITAL_BASED_OUTPATIENT_CLINIC_OR_DEPARTMENT_OTHER)
Admission: EM | Admit: 2022-03-12 | Discharge: 2022-03-12 | Disposition: A | Discharge status: Home / Self Care | Payer: Medicaid Other | Attending: Emergency Medicine | Admitting: Emergency Medicine

## 2022-03-12 DIAGNOSIS — Y93I9 Activity, other involving external motion: Secondary | ICD-10-CM | POA: Diagnosis not present

## 2022-03-12 DIAGNOSIS — M79631 Pain in right forearm: Secondary | ICD-10-CM | POA: Diagnosis not present

## 2022-03-12 DIAGNOSIS — M79601 Pain in right arm: Secondary | ICD-10-CM | POA: Diagnosis not present

## 2022-03-12 DIAGNOSIS — M79641 Pain in right hand: Secondary | ICD-10-CM | POA: Insufficient documentation

## 2022-03-12 DIAGNOSIS — W228XXA Striking against or struck by other objects, initial encounter: Secondary | ICD-10-CM | POA: Diagnosis not present
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
## 2022-03-12 NOTE — ED Notes (Signed)
Patient given discharge instructions, all questions answered. Patient in possession of all belongings, directed to the discharge area  
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
## 2022-03-14 ENCOUNTER — Emergency Department (HOSPITAL_BASED_OUTPATIENT_CLINIC_OR_DEPARTMENT_OTHER): Payer: Medicaid Other

## 2022-03-14 ENCOUNTER — Other Ambulatory Visit: Payer: Self-pay

## 2022-03-14 ENCOUNTER — Encounter (HOSPITAL_BASED_OUTPATIENT_CLINIC_OR_DEPARTMENT_OTHER): Payer: Self-pay | Admitting: Obstetrics and Gynecology

## 2022-03-14 ENCOUNTER — Emergency Department (HOSPITAL_BASED_OUTPATIENT_CLINIC_OR_DEPARTMENT_OTHER)
Admission: EM | Admit: 2022-03-14 | Discharge: 2022-03-14 | Disposition: A | Discharge status: Home / Self Care | Payer: Medicaid Other | Attending: Emergency Medicine | Admitting: Emergency Medicine

## 2022-03-14 DIAGNOSIS — W230XXD Caught, crushed, jammed, or pinched between moving objects, subsequent encounter: Secondary | ICD-10-CM | POA: Insufficient documentation

## 2022-03-14 DIAGNOSIS — S63501D Unspecified sprain of right wrist, subsequent encounter: Secondary | ICD-10-CM | POA: Diagnosis not present

## 2022-03-14 DIAGNOSIS — M25531 Pain in right wrist: Secondary | ICD-10-CM | POA: Diagnosis not present
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
## 2022-03-14 NOTE — ED Notes (Signed)
RN provided AVS using Teachback Method. Patient verbalizes understanding of Discharge Instructions. Opportunity for Questioning and Answers were provided by RN. Patient Discharged from ED ambulatory to Home via Self.  

## 2022-03-14 NOTE — ED Triage Notes (Signed)
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 Patient reports she was told if her pain got worse to come back and the pain has worsened. Pain 5-6/10.

## 2022-03-14 NOTE — Discharge Instructions (Addendum)
You are seen in the emergency department for continued right wrist pain.  Repeat x-rays do not show any acute fracture.  They did see some arthritis at the base of the thumb.  Please use the wrist splint.  Continue ibuprofen and ice.  Contact Dr. Fredna Dow hand surgery for follow-up.

## 2022-03-14 NOTE — ED Notes (Signed)
MD at the Bedside. 

## 2022-03-18 DIAGNOSIS — M778 Other enthesopathies, not elsewhere classified: Secondary | ICD-10-CM | POA: Diagnosis not present

## 2022-05-06 ENCOUNTER — Ambulatory Visit (HOSPITAL_BASED_OUTPATIENT_CLINIC_OR_DEPARTMENT_OTHER): Payer: Self-pay | Admitting: Nurse Practitioner

## 2022-05-13 ENCOUNTER — Encounter (HOSPITAL_BASED_OUTPATIENT_CLINIC_OR_DEPARTMENT_OTHER): Payer: Self-pay | Admitting: Nurse Practitioner

## 2022-10-04 IMAGING — DX DG WRIST COMPLETE 3+V*R*
4 series · 4 of 4 positions shown · non-contrast
Comparison: Right wrist radiographs 03/12/2022

CLINICAL DATA: Right wrist pain after jammed it. Distal radius
pain. Injury 03/12/2022

EXAM:
RIGHT WRIST - COMPLETE 3+ VIEW

[wrist ap (1 of 2)]
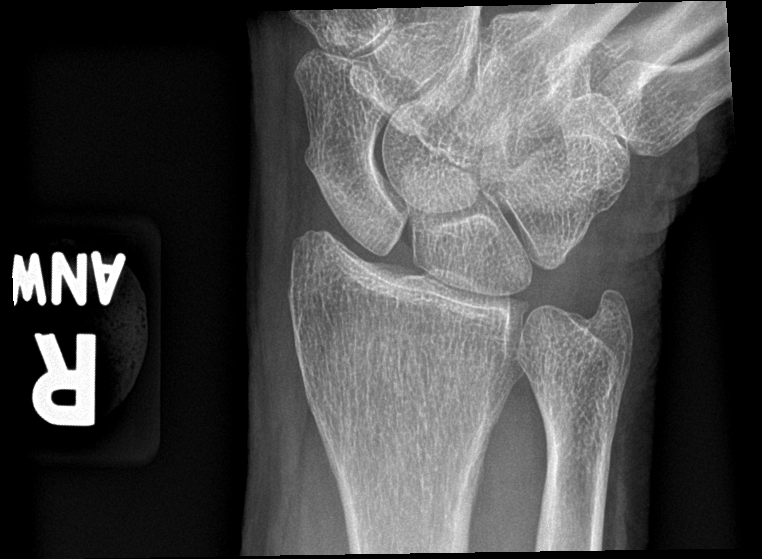

[wrist obl]
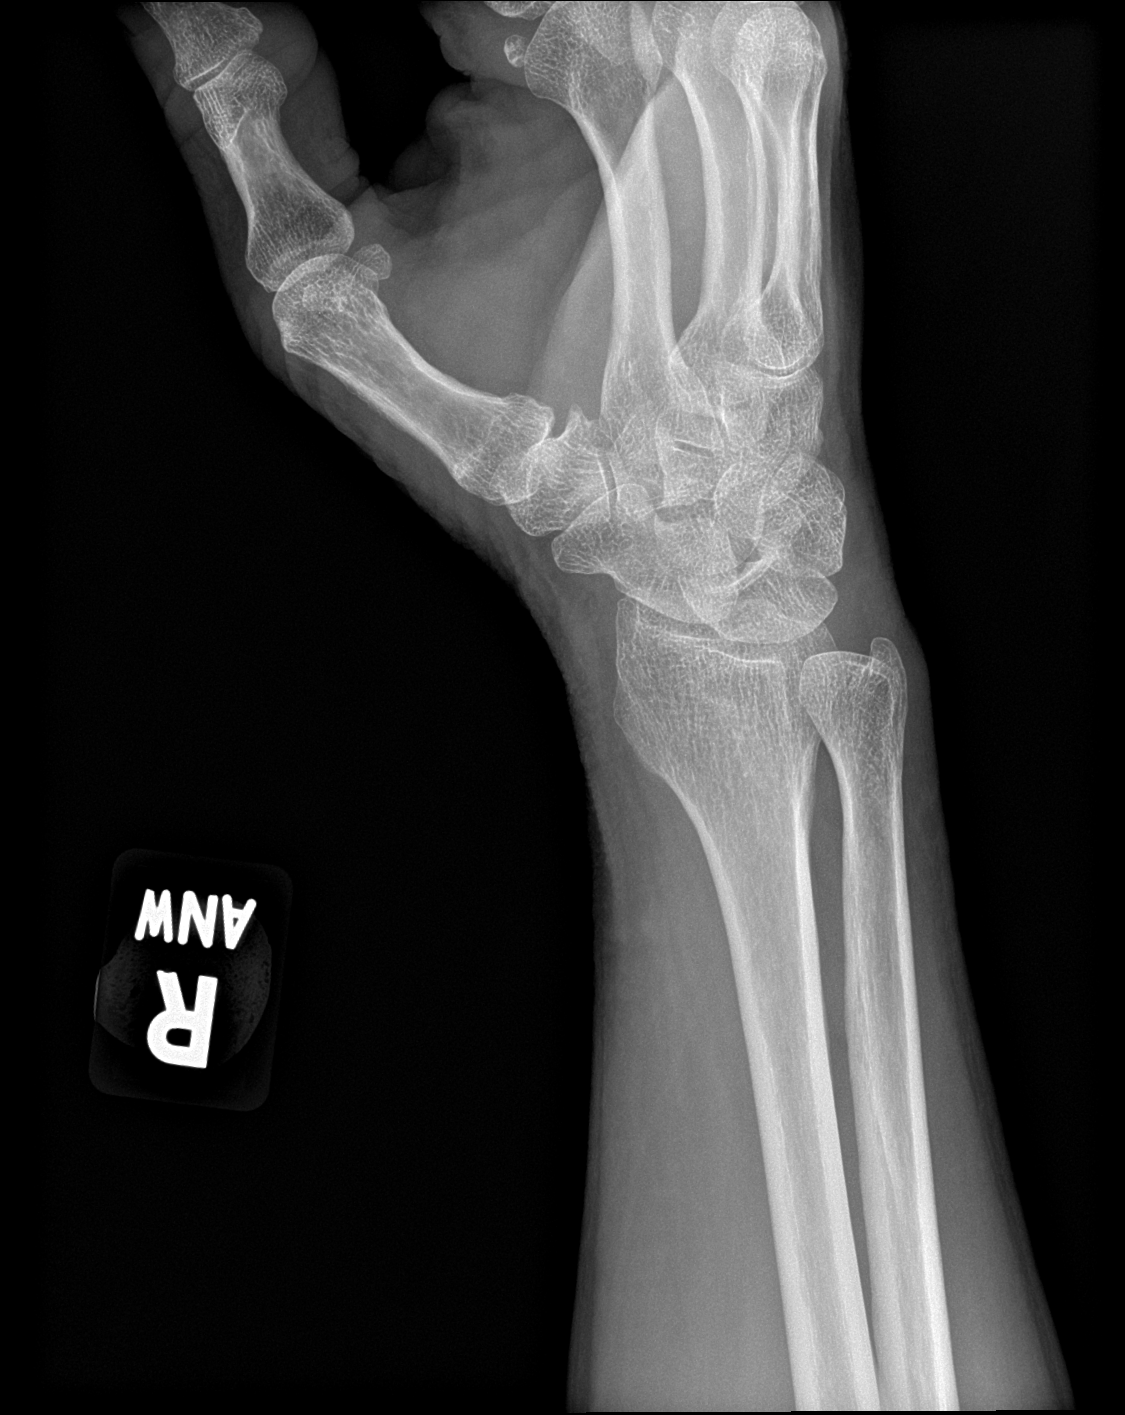

[wrist lat]
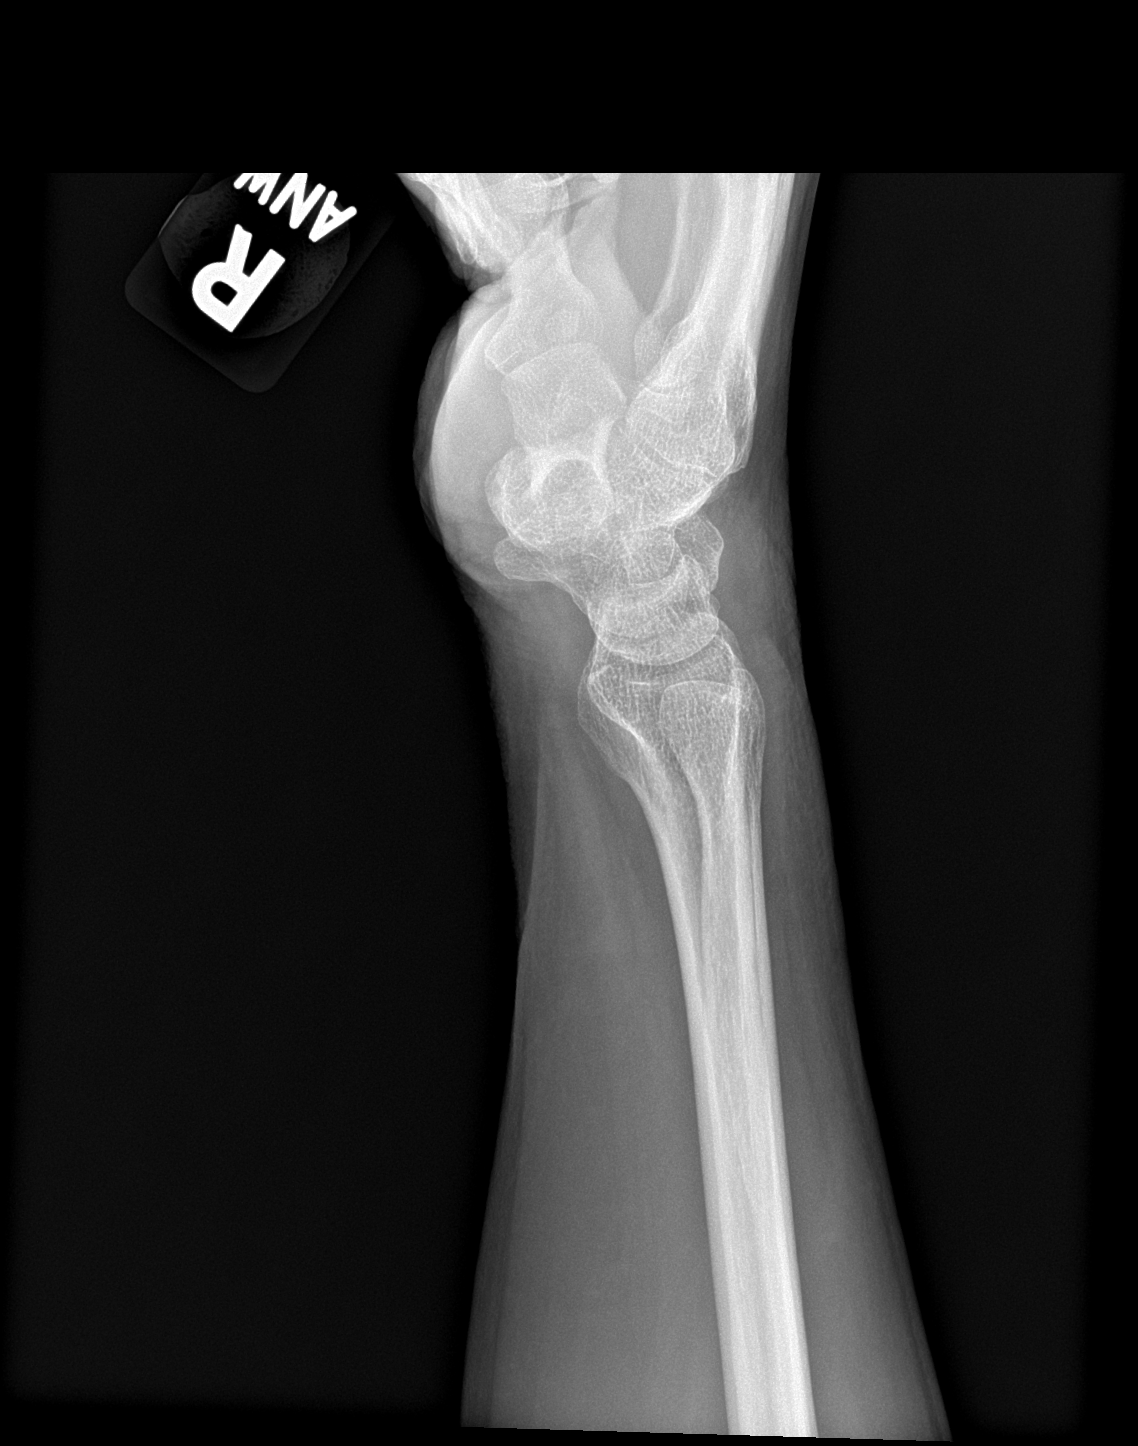

[wrist ap (2 of 2)]
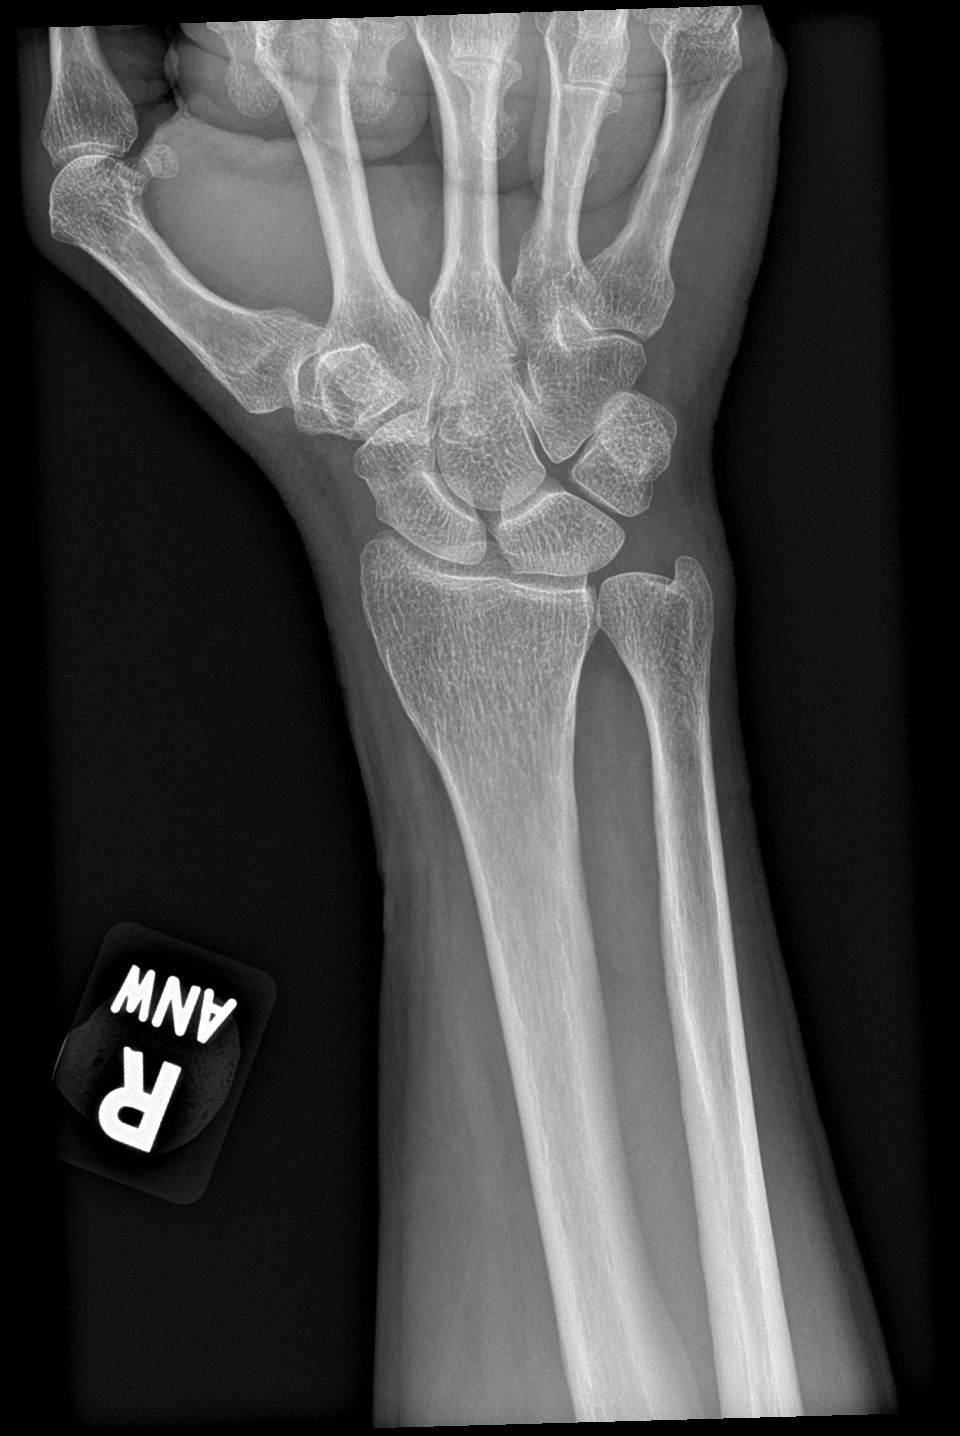

[4 of 4 positions shown; findings below may reference images not displayed]

FINDINGS: Mild thumb carpometacarpal joint space narrowing and peripheral
osteophytosis is unchanged. Joint spaces otherwise preserved. No
acute fracture is seen. No dislocation.
IMPRESSION: Mild thumb carpometacarpal osteoarthritis.

## 2023-01-29 DIAGNOSIS — Z419 Encounter for procedure for purposes other than remedying health state, unspecified: Secondary | ICD-10-CM | POA: Diagnosis not present

## 2023-03-01 DIAGNOSIS — Z419 Encounter for procedure for purposes other than remedying health state, unspecified: Secondary | ICD-10-CM | POA: Diagnosis not present

## 2023-03-31 DIAGNOSIS — Z419 Encounter for procedure for purposes other than remedying health state, unspecified: Secondary | ICD-10-CM | POA: Diagnosis not present

## 2023-05-01 DIAGNOSIS — Z419 Encounter for procedure for purposes other than remedying health state, unspecified: Secondary | ICD-10-CM | POA: Diagnosis not present

## 2023-06-01 DIAGNOSIS — Z419 Encounter for procedure for purposes other than remedying health state, unspecified: Secondary | ICD-10-CM | POA: Diagnosis not present

## 2023-07-16 ENCOUNTER — Encounter (HOSPITAL_BASED_OUTPATIENT_CLINIC_OR_DEPARTMENT_OTHER): Payer: Self-pay | Admitting: Emergency Medicine

## 2023-07-16 ENCOUNTER — Emergency Department (HOSPITAL_BASED_OUTPATIENT_CLINIC_OR_DEPARTMENT_OTHER): Payer: Medicaid Other | Admitting: Radiology

## 2023-07-16 ENCOUNTER — Other Ambulatory Visit: Payer: Self-pay

## 2023-07-16 ENCOUNTER — Emergency Department (HOSPITAL_BASED_OUTPATIENT_CLINIC_OR_DEPARTMENT_OTHER)
Admission: EM | Admit: 2023-07-16 | Discharge: 2023-07-16 | Disposition: A | Discharge status: Home / Self Care | Payer: Medicaid Other

## 2023-07-16 DIAGNOSIS — W5512XA Struck by horse, initial encounter: Secondary | ICD-10-CM | POA: Diagnosis not present

## 2023-07-16 DIAGNOSIS — M79671 Pain in right foot: Secondary | ICD-10-CM | POA: Diagnosis not present

## 2023-07-16 NOTE — ED Triage Notes (Signed)
Pt 's right foot (back) was stepped on by horse last week, has not gotten any better. Sore ,tender/swollen.

## 2023-07-16 NOTE — ED Provider Notes (Signed)
[place] EMERGENCY DEPARTMENT AT [facility] Provider Note   CSN: [id number] Arrival date & time: 07/16/23  0932     History  No chief complaint on file.   [name] is a 52 y.o. female.  52 year old female present emergency department with right heel pain.  Reports that her horse stepped on her heel almost a week ago.  Pain has persisted.  No numbness tingling changes in sensation.  Is able to walk, but painful.        Home Medications Prior to Admission medications   Medication Sig Start Date End Date Taking? Authorizing Provider  calcium carbonate (TUMS - DOSED IN MG ELEMENTAL CALCIUM) 500 MG chewable tablet Chew 4-5 tablets by mouth daily.    [provider]  famotidine (PEPCID) 20 MG tablet Take 20 mg by mouth 2 (two) times daily as needed for heartburn or indigestion.    [provider]  ibuprofen (ADVIL) 200 MG tablet Take 600 mg by mouth at bedtime.    [provider]  omeprazole (PRILOSEC) 20 MG capsule Take 1 capsule (20 mg total) by mouth 2 (two) times daily before a meal. 09/23/20   Fayrene Helper, PA-C  ondansetron (ZOFRAN) 4 MG tablet Take 1 tablet (4 mg total) by mouth every 8 (eight) hours as needed for nausea or vomiting. 09/23/20   Fayrene Helper, PA-C  simethicone (MYLICON) 125 MG chewable tablet Chew 125 mg by mouth every 6 (six) hours as needed for flatulence.    [provider]      Allergies    Ambien [zolpidem], Ciprofloxacin, and Tramadol    Review of Systems   Review of Systems  Physical Exam Updated Vital Signs BP 111/73   Pulse 61   Temp 98.3 F (36.8 C) (Oral)   Resp 16   Wt 62.1 kg   LMP 09/23/2016   SpO2 100%   BMI 22.11 kg/m  Physical Exam Vitals and nursing note reviewed.  HENT:     Head: Normocephalic.  Eyes:     Conjunctiva/sclera: Conjunctivae normal.  Cardiovascular:     Rate and Rhythm: Normal rate.  Pulmonary:     Effort: Pulmonary effort is normal.  Musculoskeletal:      Comments: Right ankle with no swelling or ecchymosis.  No tenderness to the medial or lateral malleolus.  2+ DP pulses.  Neurovascularly intact.  Does have some point tenderness to the posterior calcaneus at the insertion of the Achilles tendon.  Skin:    General: Skin is warm and dry.     Capillary Refill: Capillary refill takes less than 2 seconds.  Neurological:     Mental Status: She is alert and oriented to person, place, and time.  Psychiatric:        Mood and Affect: Mood normal.        Behavior: Behavior normal.     ED Results / Procedures / Treatments   Labs (all labs ordered are listed, but only abnormal results are displayed) Labs Reviewed - No data to display  EKG None  Radiology DG Ankle 2 Views Right  Result Date: 07/16/2023 CLINICAL DATA:  Right heel pain after injury with horse last week. EXAM: RIGHT ANKLE - 2 VIEW COMPARISON:  None Available. FINDINGS: There is no evidence of fracture, dislocation, or joint effusion. There is no evidence of arthropathy or other focal bone abnormality. Soft tissues are unremarkable. IMPRESSION: Negative. Electronically Signed   By: Lupita Raider M.D.   On: 07/16/2023 11:39  Procedures Procedures    Medications Ordered in ED Medications - No data to display  ED Course/ Medical Decision Making/ A&P Clinical Course as of 07/16/23 1541  Wed Jul 16, 2023  1145 DG Ankle 2 Views Right IMPRESSION: Negative.   [TY]  1150 Patient updated on results.  Offered crutches or cam boot for symptomatic care, declined.  Stable for discharge.  Will give follow-up with podiatry. [TY]    Clinical Course User Index [TY] Coral Spikes, DO                                 Medical Decision Making Well-appearing 52 year old female presents emergency department with heel pain after a horse stepped on her heel.  Physical exam reassuring.  Almost 1 week out from injury.  Will get x-ray to evaluate for acute osseous abnormality.  See ED  course for further MDM disposition.  Amount and/or Complexity of Data Reviewed Labs:     Details: Consider labs, however with likely MSK etiology and labs unlikely to change management or disposition at this time. Radiology: ordered. Decision-making details documented in ED Course.  Risk Decision regarding hospitalization. Risk Details: Patient unlikely to benefit from hospitalization given the nature of her injury.          Final Clinical Impression(s) / ED Diagnoses Final diagnoses:  Pain of right heel    Rx / DC Orders ED Discharge Orders     None         Coral Spikes, DO 07/16/23 1541

## 2023-07-16 NOTE — Discharge Instructions (Signed)
May continue take Tylenol turning with Motrin for pain.  Follow-up with podiatry as needed.  Return immediately for fevers, chills, worsening pain, inability to walk, numbness tingling changes in sensation to foot or any new or worsening symptoms that are concerning to you.

## 2023-07-29 ENCOUNTER — Ambulatory Visit: Payer: Medicaid Other | Admitting: Podiatry

## 2023-08-01 DIAGNOSIS — Z419 Encounter for procedure for purposes other than remedying health state, unspecified: Secondary | ICD-10-CM | POA: Diagnosis not present

## 2023-08-31 DIAGNOSIS — Z419 Encounter for procedure for purposes other than remedying health state, unspecified: Secondary | ICD-10-CM | POA: Diagnosis not present

## 2023-10-01 DIAGNOSIS — Z419 Encounter for procedure for purposes other than remedying health state, unspecified: Secondary | ICD-10-CM | POA: Diagnosis not present

## 2023-11-01 DIAGNOSIS — Z419 Encounter for procedure for purposes other than remedying health state, unspecified: Secondary | ICD-10-CM | POA: Diagnosis not present

## 2023-11-29 DIAGNOSIS — Z419 Encounter for procedure for purposes other than remedying health state, unspecified: Secondary | ICD-10-CM | POA: Diagnosis not present

## 2024-01-10 DIAGNOSIS — Z419 Encounter for procedure for purposes other than remedying health state, unspecified: Secondary | ICD-10-CM | POA: Diagnosis not present

## 2024-02-09 DIAGNOSIS — Z419 Encounter for procedure for purposes other than remedying health state, unspecified: Secondary | ICD-10-CM | POA: Diagnosis not present

## 2024-03-11 DIAGNOSIS — Z419 Encounter for procedure for purposes other than remedying health state, unspecified: Secondary | ICD-10-CM | POA: Diagnosis not present

## 2024-04-10 DIAGNOSIS — Z419 Encounter for procedure for purposes other than remedying health state, unspecified: Secondary | ICD-10-CM | POA: Diagnosis not present

## 2024-05-11 DIAGNOSIS — Z419 Encounter for procedure for purposes other than remedying health state, unspecified: Secondary | ICD-10-CM | POA: Diagnosis not present

## 2024-06-11 DIAGNOSIS — Z419 Encounter for procedure for purposes other than remedying health state, unspecified: Secondary | ICD-10-CM | POA: Diagnosis not present

## 2024-07-11 DIAGNOSIS — Z419 Encounter for procedure for purposes other than remedying health state, unspecified: Secondary | ICD-10-CM | POA: Diagnosis not present
